# Patient Record
Sex: Female | Born: 1970
Health system: Southern US, Community
[De-identification: ages and names within clinical notes are randomized; demographics above are authoritative.]

## PROBLEM LIST (undated history)

## (undated) DIAGNOSIS — M549 Dorsalgia, unspecified: Secondary | ICD-10-CM

## (undated) DIAGNOSIS — R87619 Unspecified abnormal cytological findings in specimens from cervix uteri: Secondary | ICD-10-CM

## (undated) DIAGNOSIS — IMO0002 Reserved for concepts with insufficient information to code with codable children: Secondary | ICD-10-CM

## (undated) DIAGNOSIS — C801 Malignant (primary) neoplasm, unspecified: Secondary | ICD-10-CM

## (undated) DIAGNOSIS — K5090 Crohn's disease, unspecified, without complications: Secondary | ICD-10-CM

## (undated) DIAGNOSIS — E559 Vitamin D deficiency, unspecified: Secondary | ICD-10-CM

## (undated) DIAGNOSIS — E739 Lactose intolerance, unspecified: Secondary | ICD-10-CM

## (undated) DIAGNOSIS — M2550 Pain in unspecified joint: Secondary | ICD-10-CM

## (undated) DIAGNOSIS — D649 Anemia, unspecified: Secondary | ICD-10-CM

## (undated) HISTORY — PX: WISDOM TOOTH EXTRACTION: SHX21

## (undated) HISTORY — PX: HYSTEROSCOPY: SHX211

## (undated) HISTORY — DX: Pain in unspecified joint: M25.50

## (undated) HISTORY — DX: Lactose intolerance, unspecified: E73.9

## (undated) HISTORY — PX: CERVICAL CONE BIOPSY: SUR198

## (undated) HISTORY — PX: TYMPANOSTOMY TUBE PLACEMENT: SHX32

## (undated) HISTORY — PX: MYRINGOTOMY: SUR874

## (undated) HISTORY — DX: Dorsalgia, unspecified: M54.9

---

## 1998-05-10 ENCOUNTER — Emergency Department (HOSPITAL_COMMUNITY): Admission: EM | Admit: 1998-05-10 | Discharge: 1998-05-10 | Payer: Self-pay | Admitting: Emergency Medicine

## 2004-01-11 ENCOUNTER — Emergency Department (HOSPITAL_COMMUNITY): Admission: EM | Admit: 2004-01-11 | Discharge: 2004-01-11 | Payer: Self-pay | Admitting: Emergency Medicine

## 2006-03-16 ENCOUNTER — Encounter: Admission: RE | Admit: 2006-03-16 | Discharge: 2006-03-16 | Payer: Self-pay | Admitting: Family Medicine

## 2006-07-23 ENCOUNTER — Emergency Department (HOSPITAL_COMMUNITY): Admission: EM | Admit: 2006-07-23 | Discharge: 2006-07-23 | Payer: Self-pay | Admitting: Family Medicine

## 2011-07-01 ENCOUNTER — Other Ambulatory Visit (HOSPITAL_COMMUNITY)
Admission: RE | Admit: 2011-07-01 | Discharge: 2011-07-01 | Disposition: A | Discharge status: Home / Self Care | Payer: PRIVATE HEALTH INSURANCE | Source: Ambulatory Visit | Attending: Family Medicine | Admitting: Family Medicine

## 2011-07-01 DIAGNOSIS — Z1159 Encounter for screening for other viral diseases: Secondary | ICD-10-CM | POA: Insufficient documentation

## 2011-07-01 DIAGNOSIS — Z124 Encounter for screening for malignant neoplasm of cervix: Secondary | ICD-10-CM | POA: Insufficient documentation

## 2011-11-18 ENCOUNTER — Other Ambulatory Visit (HOSPITAL_COMMUNITY)
Admission: RE | Admit: 2011-11-18 | Discharge: 2011-11-18 | Disposition: A | Discharge status: Home / Self Care | Payer: PRIVATE HEALTH INSURANCE | Source: Ambulatory Visit | Attending: Family Medicine | Admitting: Family Medicine

## 2011-11-18 DIAGNOSIS — R87610 Atypical squamous cells of undetermined significance on cytologic smear of cervix (ASC-US): Secondary | ICD-10-CM | POA: Insufficient documentation

## 2011-12-22 DIAGNOSIS — C801 Malignant (primary) neoplasm, unspecified: Secondary | ICD-10-CM

## 2011-12-22 HISTORY — PX: ABDOMINAL HYSTERECTOMY: SHX81

## 2011-12-22 HISTORY — DX: Malignant (primary) neoplasm, unspecified: C80.1

## 2012-02-18 ENCOUNTER — Encounter (HOSPITAL_BASED_OUTPATIENT_CLINIC_OR_DEPARTMENT_OTHER): Payer: Self-pay | Admitting: *Deleted

## 2012-02-18 NOTE — Progress Notes (Signed)
NPO AFTER MN. ARRIVES AT 0930.  PRE-OP ORDERS PENDING. NEEDS HG AND URINE PREG.

## 2012-02-19 ENCOUNTER — Other Ambulatory Visit: Payer: Self-pay | Admitting: Obstetrics and Gynecology

## 2012-02-22 ENCOUNTER — Encounter (HOSPITAL_BASED_OUTPATIENT_CLINIC_OR_DEPARTMENT_OTHER): Payer: Self-pay | Admitting: Anesthesiology

## 2012-02-22 ENCOUNTER — Other Ambulatory Visit: Payer: Self-pay | Admitting: Obstetrics and Gynecology

## 2012-02-22 ENCOUNTER — Encounter (HOSPITAL_BASED_OUTPATIENT_CLINIC_OR_DEPARTMENT_OTHER): Payer: Self-pay | Admitting: *Deleted

## 2012-02-22 ENCOUNTER — Encounter (HOSPITAL_BASED_OUTPATIENT_CLINIC_OR_DEPARTMENT_OTHER)
Admission: RE | Disposition: A | Discharge status: Home / Self Care | Payer: Self-pay | Source: Ambulatory Visit | Attending: Obstetrics and Gynecology

## 2012-02-22 ENCOUNTER — Ambulatory Visit (HOSPITAL_BASED_OUTPATIENT_CLINIC_OR_DEPARTMENT_OTHER)
Admission: RE | Admit: 2012-02-22 | Discharge: 2012-02-22 | Disposition: A | Discharge status: Home / Self Care | Payer: PRIVATE HEALTH INSURANCE | Source: Ambulatory Visit | Attending: Obstetrics and Gynecology | Admitting: Obstetrics and Gynecology

## 2012-02-22 ENCOUNTER — Ambulatory Visit (HOSPITAL_BASED_OUTPATIENT_CLINIC_OR_DEPARTMENT_OTHER): Payer: PRIVATE HEALTH INSURANCE | Admitting: Anesthesiology

## 2012-02-22 DIAGNOSIS — Z87442 Personal history of urinary calculi: Secondary | ICD-10-CM | POA: Insufficient documentation

## 2012-02-22 DIAGNOSIS — D069 Carcinoma in situ of cervix, unspecified: Secondary | ICD-10-CM | POA: Insufficient documentation

## 2012-02-22 DIAGNOSIS — M25569 Pain in unspecified knee: Secondary | ICD-10-CM | POA: Insufficient documentation

## 2012-02-22 HISTORY — PX: LEEP: SHX91

## 2012-02-22 HISTORY — DX: Unspecified abnormal cytological findings in specimens from cervix uteri: R87.619

## 2012-02-22 HISTORY — DX: Reserved for concepts with insufficient information to code with codable children: IMO0002

## 2012-02-22 HISTORY — DX: Anemia, unspecified: D64.9

## 2012-02-22 LAB — POCT HEMOGLOBIN-HEMACUE: Hemoglobin: 11.7 g/dL — ABNORMAL LOW (ref 12.0–15.0)

## 2012-02-22 SURGERY — LEEP (LOOP ELECTROSURGICAL EXCISION PROCEDURE)
Anesthesia: General | Site: Cervix | Wound class: Clean Contaminated

## 2012-02-22 MED ORDER — MIDAZOLAM HCL 5 MG/5ML IJ SOLN
INTRAMUSCULAR | Status: DC | PRN
  Administered 2012-02-22: 2 mg via INTRAVENOUS

## 2012-02-22 MED ORDER — ACETAMINOPHEN 10 MG/ML IV SOLN
INTRAVENOUS | Status: DC | PRN
  Administered 2012-02-22: 1000 mg via INTRAVENOUS

## 2012-02-22 MED ORDER — CEFAZOLIN SODIUM-DEXTROSE 2-3 GM-% IV SOLR
2.0000 g | INTRAVENOUS | Status: AC
  Administered 2012-02-22: 2 g via INTRAVENOUS

## 2012-02-22 MED ORDER — FENTANYL CITRATE 0.05 MG/ML IJ SOLN
25.0000 ug | INTRAMUSCULAR | Status: DC | PRN
  Administered 2012-02-22: 25 ug via INTRAVENOUS

## 2012-02-22 MED ORDER — ACETIC ACID 5 % SOLN
Status: DC | PRN
  Administered 2012-02-22: 1 via TOPICAL

## 2012-02-22 MED ORDER — KETOROLAC TROMETHAMINE 30 MG/ML IJ SOLN
INTRAMUSCULAR | Status: DC | PRN
  Administered 2012-02-22: 30 mg via INTRAVENOUS

## 2012-02-22 MED ORDER — PROPOFOL 10 MG/ML IV EMUL
INTRAVENOUS | Status: DC | PRN
  Administered 2012-02-22: 200 mg via INTRAVENOUS

## 2012-02-22 MED ORDER — LIDOCAINE HCL (CARDIAC) 20 MG/ML IV SOLN
INTRAVENOUS | Status: DC | PRN
  Administered 2012-02-22: 100 mg via INTRAVENOUS

## 2012-02-22 MED ORDER — LACTATED RINGERS IV SOLN
INTRAVENOUS | Status: DC
  Administered 2012-02-22: 13:00:00 via INTRAVENOUS

## 2012-02-22 MED ORDER — NAPROXEN SODIUM 220 MG PO TABS: ORAL_TABLET | ORAL | Status: DC

## 2012-02-22 MED ORDER — IODINE STRONG (LUGOLS) 5 % PO SOLN
ORAL | Status: DC | PRN
  Administered 2012-02-22: 0.1 mL via ORAL

## 2012-02-22 MED ORDER — DEXAMETHASONE SODIUM PHOSPHATE 4 MG/ML IJ SOLN
INTRAMUSCULAR | Status: DC | PRN
  Administered 2012-02-22: 10 mg via INTRAVENOUS

## 2012-02-22 MED ORDER — FENTANYL CITRATE 0.05 MG/ML IJ SOLN
INTRAMUSCULAR | Status: DC | PRN
  Administered 2012-02-22 (×5): 50 ug via INTRAVENOUS

## 2012-02-22 MED ORDER — LACTATED RINGERS IV SOLN
INTRAVENOUS | Status: DC
  Administered 2012-02-22: 10:00:00 via INTRAVENOUS

## 2012-02-22 MED ORDER — LIDOCAINE HCL 2 % IJ SOLN
INTRAMUSCULAR | Status: DC | PRN
  Administered 2012-02-22: 10 mL

## 2012-02-22 MED ORDER — ONDANSETRON HCL 4 MG/2ML IJ SOLN
INTRAMUSCULAR | Status: DC | PRN
  Administered 2012-02-22: 4 mg via INTRAVENOUS

## 2012-02-22 SURGICAL SUPPLY — 33 items
CATH ROBINSON RED A/P 16FR (CATHETERS) ×2 IMPLANT
CLOTH BEACON ORANGE TIMEOUT ST (SAFETY) ×2 IMPLANT
COVER TABLE BACK 60X90 (DRAPES) ×2 IMPLANT
DRAPE LG THREE QUARTER DISP (DRAPES) ×2 IMPLANT
DRAPE UNDERBUTTOCKS STRL (DRAPE) ×2 IMPLANT
DRESSING TELFA 8X3 (GAUZE/BANDAGES/DRESSINGS) ×2 IMPLANT
ELECT BALL LEEP 3MM BLK (ELECTRODE) IMPLANT
ELECT BALL LEEP 5MM RED (ELECTRODE) ×1 IMPLANT
ELECT LOOP 20X12 DISP (CUTTING LOOP)
ELECT LOOP LLETZ 10X10 DISP (CUTTING LOOP)
ELECT LOOP LLETZ 15X12 DISP (CUTTING LOOP) ×1 IMPLANT
ELECTRODE LOOP 20X12 DISP (CUTTING LOOP) IMPLANT
ELECTRODE LOOP LTZ 10X10 DISP (CUTTING LOOP) IMPLANT
GLOVE BIO SURGEON STRL SZ 6.5 (GLOVE) ×2 IMPLANT
GLOVE INDICATOR 6.5 STRL GRN (GLOVE) ×2 IMPLANT
GLOVE INDICATOR 7.0 STRL GRN (GLOVE) ×4 IMPLANT
GOWN PREVENTION PLUS LG XLONG (DISPOSABLE) ×2 IMPLANT
GOWN STRL NON-REIN LRG LVL3 (GOWN DISPOSABLE) ×1 IMPLANT
LEGGING LITHOTOMY PAIR STRL (DRAPES) ×2 IMPLANT
NDL SPNL 22GX3.5 QUINCKE BK (NEEDLE) ×1 IMPLANT
NEEDLE SPNL 22GX3.5 QUINCKE BK (NEEDLE) ×2 IMPLANT
NS IRRIG 500ML POUR BTL (IV SOLUTION) IMPLANT
PACK BASIN DAY SURGERY FS (CUSTOM PROCEDURE TRAY) ×2 IMPLANT
PAD OB MATERNITY 4.3X12.25 (PERSONAL CARE ITEMS) ×2 IMPLANT
PAD PREP 24X48 CUFFED NSTRL (MISCELLANEOUS) ×2 IMPLANT
SCOPETTES 8  STERILE (MISCELLANEOUS) ×3
SCOPETTES 8 STERILE (MISCELLANEOUS) ×3 IMPLANT
SURGILUBE 2OZ TUBE FLIPTOP (MISCELLANEOUS) ×2 IMPLANT
SYR CONTROL 10ML LL (SYRINGE) ×2 IMPLANT
TOWEL OR 17X24 6PK STRL BLUE (TOWEL DISPOSABLE) ×2 IMPLANT
TRAY DSU PREP LF (CUSTOM PROCEDURE TRAY) ×2 IMPLANT
VACUUM HOSE/TUBING 7/8INX6FT (MISCELLANEOUS) ×2 IMPLANT
WATER STERILE IRR 500ML POUR (IV SOLUTION) ×3 IMPLANT

## 2012-02-22 NOTE — H&P (View-Only) (Signed)
02/18/2012  History of Present Illness  General:  41 y/o G0 presents for pre-op for LEEP due to severe dysplasia by colposcopy. Pt declines in office procedure in favor of outpt surgery center. She request sedation due to anxiety and discomfort with pelvic exams. Pt is doing well today. No complaints. Pt due to start menses 4 days after procedure. Agreeable to Provera to delay menses.   Current Medications  Multivitamins Tablet as directed   Medication List reviewed and reconciled with the patient   Past Medical History  Kidney stones  Right knee pain   Surgical History  PE tubes at 41 y/o    Family History  Father: dibetes, kidney disease, hypertension   Mother: kidney stones   denies any GYN family cancer hx.   Gyn History  Sexual activity currently sexually active.  Periods : every month.  LMP 01/28/12.  Birth control condoms.  Last pap smear date 11/18/11, HGSIL.  Abnormal pap smear HGSIL.  Menarche 46.    OB History  Never been pregnant per patient.    Allergies  N.K.D.A.   Hospitalization/Major Diagnostic Procedure  PE Tubes at age 11    Vital Signs  Wt 281, Wt change -1 lb, Ht 69.5, BMI 40.90, Temp 98.5, Pulse sitting 89, BP sitting 118/70.   Physical Examination  GENERAL:  Patient appears alert and oriented.  General Appearance: well-appearing, well-developed, no acute distress.  Speech: clear.  NECK:  Thyroid: no thyromegaly.  LUNGS:  General clear bilaterally, no crackles, no wheezes.  HEART:  Heart sounds: RRR, no murmur.  ABDOMEN:  General: no masses tenderness or organomegaly, non distended, BS normal, obese.  FEMALE GENITOURINARY:  General deferred.  EXTREMITIES:  Clubbing: no.  Cyanosis: no.  Edema: none.  Tremors: no.     Assessments   1. Pre-op exam - V72.84 (Primary)   2. CIN III (cervical intraepithelial neoplasia grade III) with severe dysplasia - 233.1   Treatment  1. Pre-op exam  Start Provera Tablet, 10 MG, 1 tablet,  Orally, daily, 10 days, 10, Refills 0 Pt counseled on procedure, R/B/A. All questions answered. Pt instructed to start Provera today.

## 2012-02-22 NOTE — Interval H&P Note (Signed)
History and Physical Interval Note:  02/22/2012 12:27 PM  Joan [name]  has presented today for surgery, with the diagnosis of Abnormal Pap  The various methods of treatment have been discussed with the patient and family. After consideration of risks, benefits and other options for treatment, the patient has consented to  Procedure(s) (LRB): LOOP ELECTROSURGICAL EXCISION PROCEDURE (LEEP) (N/A) as a surgical intervention .  The patients' history has been reviewed, patient examined, no change in status, stable for surgery.  I have reviewed the patients' chart and labs.  Questions were answered to the patient's satisfaction.     Simona Huh, Zaia Carre

## 2012-02-22 NOTE — Discharge Instructions (Addendum)
Loop Electrosurgical Excision Procedure Care After Refer to this sheet in the next few weeks. These instructions provide you with information on caring for yourself after your procedure. Your caregiver may also give you more specific instructions. Your treatment has been planned according to current medical practices, but problems sometimes occur. Call your caregiver if you have any problems or questions after your procedure. HOME CARE INSTRUCTIONS   Do not use tampons, douche, or have sexual intercourse for 2 weeks or as directed by your caregiver.   Begin normal activities if you have no or minimal cramping or bleeding, unless directed otherwise by your caregiver.   Take your temperature if you feel sick. Write down your temperature on paper, and tell your caregiver if you have a fever.   Take all medicines as directed by your caregiver.   Keep all your follow-up appointments and Pap tests as directed by your caregiver.  SEEK IMMEDIATE MEDICAL CARE IF:   IF YOU CHANGE OR FILL A PAD EVERY HOUR FOR 2 HOURS CONSECUTIVELY  You have bleeding that is heavier or longer than a normal menstrual cycle.   You have bleeding that is bright red.   You have blood clots.   You have a fever.   You have increasing cramps or pain not relieved by medicine.   You develop abdominal pain that does not seem to be related to the same area of earlier cramping and pain.   You are lightheaded, unusually weak, or faint.   You develop painful or bloody urination.   You develop a bad smelling vaginal discharge.  MAKE SURE YOU:  Understand these instructions.   Will watch your condition.   Will get help right away if you are not doing well or get worse.  Document Released: 08/20/2011 Document Revised: 11/26/2011 Document Reviewed: 08/20/2011 [facility] Patient Information 2012 [place]. General Anesthetic, Adult A doctor specialized in giving anesthesia (anesthesiologist) or a nurse specialized  in giving anesthesia (nurse anesthetist) gives medicine that makes you sleep while a procedure is performed (general anesthetic). Once the general anesthetic has been administered, you will be in a sleeplike state in which you feel no pain. After having a general anestheticyou may feel:   Dizzy.   Weak.   Drowsy.   Confused.  These feelings are normal and can be expected to last for up to 24 hours after the procedure is completed.  LET YOUR CAREGIVER KNOW ABOUT:  Allergies you have.   Medications you are taking, including herbs, eye drops, over the counter medications, dietary supplements, and creams.   Previous problems you have had with anesthetics or numbing medicines.   Use of cigarettes, alcohol, or illicit drugs.   Possibility of pregnancy, if this applies.   History of bleeding or blood disorders, including blood clots and clotting disorders.   Previous surgeries you have had and types of anesthetics you have received.   Family medical history, especially anesthetic problems.   Other health problems.  BEFORE THE PROCEDURE  You may brush your teeth on the morning of surgery but you should have no solid food or non-clear liquids for a minimum of 8 hours prior to your procedure. Clear liquids (water, black coffee, and tea) are acceptable in small amounts until 2 hours prior to your procedure.   You may take your regular medications the morning of your procedure unless your caregiver indicates otherwise.  AFTER THE PROCEDURE  After surgery, you will be taken to the recovery area where a nurse will  monitor your progress. You will be allowed to go home when you are awake, stable, taking fluids well, and without serious pain or complications.   For the first 24 hours following an anesthetic:   Have a responsible person with you.   Do not drive a car. If you are alone, do not take public transportation.   Do not engage in strenuous activity. You may usually resume normal  activities the next day, or as advised by your caregiver.   Do not drink alcohol.   Do not take medicine that has not been prescribed by your caregiver.   Do not sign important papers or make important decisions as your judgement may be impaired.   You may resume a normal diet as directed.   Change bandages (dressings) as directed.   Only take over-the-counter or prescription medicines for pain, discomfort, or fever as directed by your caregiver.  If you have questions or problems that seem related to the anesthetic, call the hospital and ask for the anesthetist, anesthesiologist, or anesthesia department. SEEK IMMEDIATE MEDICAL CARE IF:   You develop a rash.   You have difficulty breathing.   You have chest pain.   You have allergic problems.   You have uncontrolled nausea.   You have uncontrolled vomiting.   You develop any serious bleeding, especially from the incision site.  Document Released: 03/15/2008 Document Revised: 11/26/2011 Document Reviewed: 04/09/2011 [facility] Patient Information 2012 [place].

## 2012-02-22 NOTE — H&P (Signed)
02/18/2012  History of Present Illness  General:  41 y/o G0 presents for pre-op for LEEP due to severe dysplasia by colposcopy. Pt declines in office procedure in favor of outpt surgery center. She request sedation due to anxiety and discomfort with pelvic exams. Pt is doing well today. No complaints. Pt due to start menses 4 days after procedure. Agreeable to Provera to delay menses.   Current Medications  Multivitamins Tablet as directed   Medication List reviewed and reconciled with the patient   Past Medical History  Kidney stones  Right knee pain   Surgical History  PE tubes at 41 y/o    Family History  Father: dibetes, kidney disease, hypertension   Mother: kidney stones   denies any GYN family cancer hx.   Gyn History  Sexual activity currently sexually active.  Periods : every month.  LMP 01/28/12.  Birth control condoms.  Last pap smear date 11/18/11, HGSIL.  Abnormal pap smear HGSIL.  Menarche 43.    OB History  Never been pregnant per patient.    Allergies  N.K.D.A.   Hospitalization/Major Diagnostic Procedure  PE Tubes at age 12    Vital Signs  Wt 281, Wt change -1 lb, Ht 69.5, BMI 40.90, Temp 98.5, Pulse sitting 89, BP sitting 118/70.   Physical Examination  GENERAL:  Patient appears alert and oriented.  General Appearance: well-appearing, well-developed, no acute distress.  Speech: clear.  NECK:  Thyroid: no thyromegaly.  LUNGS:  General clear bilaterally, no crackles, no wheezes.  HEART:  Heart sounds: RRR, no murmur.  ABDOMEN:  General: no masses tenderness or organomegaly, non distended, BS normal, obese.  FEMALE GENITOURINARY:  General deferred.  EXTREMITIES:  Clubbing: no.  Cyanosis: no.  Edema: none.  Tremors: no.     Assessments   1. Pre-op exam - V72.84 (Primary)   2. CIN III (cervical intraepithelial neoplasia grade III) with severe dysplasia - 233.1   Treatment  1. Pre-op exam  Start Provera Tablet, 10 MG, 1 tablet,  Orally, daily, 10 days, 10, Refills 0 Pt counseled on procedure, R/B/A. All questions answered. Pt instructed to start Provera today.

## 2012-02-22 NOTE — Transfer of Care (Signed)
Immediate Anesthesia Transfer of Care Note  Patient: Joan [name]  Procedure(s) Performed: Procedure(s) (LRB): LOOP ELECTROSURGICAL EXCISION PROCEDURE (LEEP) (N/A)  Patient Location: PACU  Anesthesia Type: General  Level of Consciousness: drowsy, arouses to name, follows commands  Airway & Oxygen Therapy: Patient Spontanous Breathing and Patient connected to face mask oxygen  Post-op Assessment: Report given to PACU RN and Post -op Vital signs reviewed and stable  Post vital signs: Reviewed and stable  Complications: No apparent anesthesia complications

## 2012-02-22 NOTE — Brief Op Note (Signed)
02/22/2012  12:44 PM  PATIENT:  Joan [name]  41 y.o. female  PRE-OPERATIVE DIAGNOSIS:  Cervical Dysplasia, CIN III  POST-OPERATIVE DIAGNOSIS:  Cervical Dysplasia, CIN III  PROCEDURE:  Procedure(s) (LRB): LOOP ELECTROSURGICAL EXCISION PROCEDURE (LEEP) (N/A)  SURGEON:  Surgeon(s) and Role:    * Thurnell Lose, MD - Primary  PHYSICIAN ASSISTANT: None  ASSISTANTS: Technician   ANESTHESIA:   LMA  EBL:  Total I/O In: 900 [I.V.:900] Out: - EBL less than 50  BLOOD ADMINISTERED:none  DRAINS: none   LOCAL MEDICATIONS USED:  LIDOCAINE 2% without epinephrine  SPECIMEN:  Source of Specimen:  Cervix-anterior, posterior, endocervix  DISPOSITION OF SPECIMEN:  PATHOLOGY  COUNTS:  YES  TOURNIQUET:  * No tourniquets in log *  DICTATION: .Other Dictation: Dictation Number [id number]  PLAN OF CARE: Discharge to home after PACU  PATIENT DISPOSITION:  PACU - hemodynamically stable.   Delay start of Pharmacological VTE agent (>24hrs) due to surgical blood loss or risk of bleeding: yes

## 2012-02-22 NOTE — Anesthesia Postprocedure Evaluation (Signed)
  Anesthesia Post-op Note  Patient: Joan [name]  Procedure(s) Performed: Procedure(s) (LRB): LOOP ELECTROSURGICAL EXCISION PROCEDURE (LEEP) (N/A)  Patient Location: PACU  Anesthesia Type: General  Level of Consciousness: awake and alert   Airway and Oxygen Therapy: Patient Spontanous Breathing  Post-op Pain: mild  Post-op Assessment: Post-op Vital signs reviewed, Patient's Cardiovascular Status Stable, Respiratory Function Stable, Patent Airway and No signs of Nausea or vomiting  Post-op Vital Signs: stable  Complications: No apparent anesthesia complications

## 2012-02-22 NOTE — Anesthesia Procedure Notes (Signed)
Procedure Name: LMA Insertion Date/Time: 02/22/2012 11:45 AM Performed by: Wyvonna Plum Pre-anesthesia Checklist: Patient identified, Emergency Drugs available, Suction available and Patient being monitored Patient Re-evaluated:Patient Re-evaluated prior to inductionOxygen Delivery Method: Circle System Utilized Preoxygenation: Pre-oxygenation with 100% oxygen Intubation Type: IV induction Ventilation: Mask ventilation without difficulty LMA: LMA with gastric port inserted LMA Size: 4.0 Number of attempts: 1 Placement Confirmation: positive ETCO2 Tube secured with: Tape Dental Injury: Teeth and Oropharynx as per pre-operative assessment

## 2012-02-22 NOTE — Anesthesia Preprocedure Evaluation (Addendum)
Anesthesia Evaluation  Patient identified by MRN, date of birth, ID band Patient awake    Reviewed: Allergy & Precautions, H&P , NPO status , Patient's Chart, lab work & pertinent test results, reviewed documented beta blocker date and time   Airway Mallampati: II TM Distance: >3 FB Neck ROM: full    Dental  (+) Teeth Intact and Dental Advisory Given   Pulmonary neg pulmonary ROS,  breath sounds clear to auscultation  Pulmonary exam normal       Cardiovascular Exercise Tolerance: Good negative cardio ROS  Rhythm:regular Rate:Normal     Neuro/Psych negative neurological ROS  negative psych ROS   GI/Hepatic negative GI ROS, Neg liver ROS,   Endo/Other  negative endocrine ROS  Renal/GU negative Renal ROS  negative genitourinary   Musculoskeletal   Abdominal   Peds  Hematology negative hematology ROS (+)   Anesthesia Other Findings   Reproductive/Obstetrics negative OB ROS                          Anesthesia Physical Anesthesia Plan  ASA: I  Anesthesia Plan: General   Post-op Pain Management:    Induction:   Airway Management Planned: LMA  Additional Equipment:   Intra-op Plan:   Post-operative Plan:   Informed Consent: I have reviewed the patients History and Physical, chart, labs and discussed the procedure including the risks, benefits and alternatives for the proposed anesthesia with the patient or authorized representative who has indicated his/her understanding and acceptance.   Dental Advisory Given  Plan Discussed with: CRNA and Surgeon  Anesthesia Plan Comments:         Anesthesia Quick Evaluation

## 2012-02-23 ENCOUNTER — Encounter (HOSPITAL_BASED_OUTPATIENT_CLINIC_OR_DEPARTMENT_OTHER): Payer: Self-pay | Admitting: Obstetrics and Gynecology

## 2012-02-23 NOTE — Addendum Note (Signed)
Addendum  created 02/23/12 6122 by Peyton Najjar, MD   Modules edited:Anesthesia Responsible Staff

## 2012-02-23 NOTE — Addendum Note (Signed)
Addendum  created 02/23/12 5913 by Peyton Najjar, MD   Modules edited:Anesthesia Responsible Staff

## 2012-02-23 NOTE — Op Note (Signed)
NAMEJANECIA, [name]               ACCOUNT NO.:  [id number]  MEDICAL RECORD NO.:  [id number]  LOCATION:                               FACILITY:  [facility]  PHYSICIAN:  Jola Schmidt, MD   DATE OF BIRTH:  16-May-1971  DATE OF PROCEDURE:  02/22/2012 DATE OF DISCHARGE:                              OPERATIVE REPORT   PREOPERATIVE DIAGNOSIS:  Cervical dysplasia, cervical intraepithelial neoplasia-3.  POSTOP DIAGNOSIS:  Cervical dysplasia, cervical intraepithelial neoplasia-3.  PROCEDURE:  Loop electrosurgical excision procedure.  SURGEON:  Jola Schmidt, MD.  PHYSICIAN ASSISTANT:  None.  ANESTHESIA:  LMA.  IV FLUIDS:  900.  ESTIMATED BLOOD LOSS:  Less than 50.  DRAINS:  No drains.  ANESTHESIA:  Local anesthetic lidocaine 2% without epinephrine, 10 cc.  SOURCE OF SPECIMEN:  Cervix; which was anterior, posterior, and endocervix.  DISPOSITION OF SPECIMENS:  To pathology.  Discharged home after PACU to PACU hemodynamically stable.  FINDINGS:  After Lugol staining larger than anticipated lesion mostly on the anterior cervix and at the transformation zone.  No discrete lesions were seen.  The procedure was uncomplicated.  Blood loss was normal.  After she underwent an LMA anesthesia without complication, she was placed in the dorsal lithotomy position  INDICATIONS:  [name] is a 41 year old, gravida 0, who had an abnormal Pap smear and after colposcopy severe dysplasia was confirmed, CIN-3.  Due to anxiety and significant discomfort during pelvic exams, the patient requested to have the procedure done at an outpatient surgery center under sedation.  She was counseled on the risks of the procedure prior to surgery in the preoperative exam and immediately in the holding area.  PROCEDURE IN DETAIL:  [name] was identified in the holding area. Consent was reviewed and all questions were answered.  She was then taken to the operating room and underwent LMA anesthesia  without complications.  A time-out was observed.  She was then prepped and draped in a normal sterile fashion.  A coated Graves speculum was then inserted into the vagina.  Lugol's solution was then applied to the cervix and decreased Lugol's uptake was noted primarily on the anterior lip of the cervix and on the lower uterine segment.  A medium-sized loop was then used and the cervix was then shaved in the anterior, posterior, and endocervical areas without complications.  Prior to proceeding, lidocaine 2% of 10 mL was injected paracervically.  After the cervical segments were removed, the LEEP bed was then cauterized with a roller ball for adequate hemostasis.  Then, the Monsel's paste was applied to the bed of the cervix.  The vagina was cleared of all clots.  All the instruments were removed from the vagina.  All instruments and sponge counts were correct x3.  The patient was awakened and taken to the recovery room in stable condition.  She received Ancef 2 g prior to the procedure.  She had SCDs in place at all times.     Jola Schmidt, MD     EBV/MEDQ  D:  02/22/2012  T:  02/23/2012  Job:  [id number]

## 2013-02-27 ENCOUNTER — Other Ambulatory Visit: Payer: Self-pay

## 2013-02-27 DIAGNOSIS — Z1231 Encounter for screening mammogram for malignant neoplasm of breast: Secondary | ICD-10-CM

## 2013-04-04 ENCOUNTER — Ambulatory Visit
Admission: RE | Admit: 2013-04-04 | Discharge: 2013-04-04 | Disposition: A | Discharge status: Home / Self Care | Payer: PRIVATE HEALTH INSURANCE | Source: Ambulatory Visit

## 2013-04-04 DIAGNOSIS — Z1231 Encounter for screening mammogram for malignant neoplasm of breast: Secondary | ICD-10-CM

## 2013-09-01 ENCOUNTER — Other Ambulatory Visit: Payer: Self-pay | Admitting: Family Medicine

## 2013-09-01 ENCOUNTER — Ambulatory Visit
Admission: RE | Admit: 2013-09-01 | Discharge: 2013-09-01 | Disposition: A | Discharge status: Home / Self Care | Payer: PRIVATE HEALTH INSURANCE | Source: Ambulatory Visit | Attending: Family Medicine | Admitting: Family Medicine

## 2013-09-01 DIAGNOSIS — C530 Malignant neoplasm of endocervix: Secondary | ICD-10-CM

## 2014-08-20 ENCOUNTER — Ambulatory Visit (INDEPENDENT_AMBULATORY_CARE_PROVIDER_SITE_OTHER): Payer: PRIVATE HEALTH INSURANCE | Admitting: Internal Medicine

## 2014-08-20 ENCOUNTER — Encounter: Payer: Self-pay | Admitting: Internal Medicine

## 2014-08-20 VITALS — BP 110/68 | HR 82 | Temp 99.0°F | Resp 12 | Ht 69.75 in | Wt 304.0 lb

## 2014-08-20 DIAGNOSIS — R635 Abnormal weight gain: Secondary | ICD-10-CM

## 2014-08-20 LAB — T3, FREE: T3 FREE: 3 pg/mL (ref 2.3–4.2)

## 2014-08-20 LAB — T4, FREE: Free T4: 0.77 ng/dL (ref 0.60–1.60)

## 2014-08-20 LAB — LUTEINIZING HORMONE: LH: 3.9 m[IU]/mL

## 2014-08-20 LAB — FOLLICLE STIMULATING HORMONE: FSH: 3.1 m[IU]/mL

## 2014-08-20 LAB — TSH: TSH: 2.56 u[IU]/mL (ref 0.35–4.50)

## 2014-08-20 NOTE — Progress Notes (Signed)
Patient ID: Joan [name], female   DOB: 1971/09/14, 43 y.o.   MRN: [id number]   HPI  Joan [name] is a 44 y.o.-year-old female, referred by her PCP, Dr. Kelton Pillar, in consultation for endocrine causes for obesity. GI: Dr Bernestine Amass.  Patient c/o: - weight gain: 15-20 lbs up and down; overall ~25 lbs  She was dx with cervical cancer in 2013 >> lost 50 lbs, GI sxs >> seen by Dr Zipporah Plants >> dx with Crohn's ds. She had a hysterectomy in 11/2012. She was started on meds for Crohn's ds. No steroids, only Abx, then Mesalamine, Pentasa >> now only on Pentasa.   Pt's meals are: - Breakfast: protein shake + banana +/- cheerios; smoothie - 1h later: snack: banana, grapes, or other fresh fruit; sometimes chocolate; sometimes protein bars - Lunch: not eating out; leftover dinner; brings from home; sometimes chai tea - Dinner: roast chicken, grilled chicken/turkey, vegetables, spinach salad; tries not to have starch - Snacks: veggies, protein bars  She saw a nutritionist before.  - No h/o DM. Last hemoglobin A1c was: 07/18/2014: 5.6%  07/17/2013: 5.7%  - No h/o hypothyroidism. Last thyroid tests: 07/18/2014: TSH 4.33, fT4 0.76 (0.61-1.12), fT3 2.57 (2.50-3.9), TPO Abs 8 (0-34) - records received after the visit 07/17/2013: TSH 3.53  - last set of lipids: 07/18/2014: 180/85/36/127  - last CMP: normal in 07/18/2014.  Pt has FH of DM in father. MGM and cousins with thyroid ds.   Had regular menses. She was on Prometrium in the past, not in last year. She does not have hot flushes.  ROS: Constitutional: + both: weight gain/loss, + fatigue, no subjective hyperthermia/hypothermia, + poor sleep Eyes: no blurry vision, no xerophthalmia ENT: no sore throat, no nodules palpated in throat, no dysphagia/odynophagia, no hoarseness Cardiovascular: no CP/SOB/palpitations/leg swelling Respiratory: no cough/SOB Gastrointestinal: no N/V/+D/no C Musculoskeletal: no muscle/+ joint aches Skin: no  rashes, + itching Neurological: no tremors/numbness/tingling/dizziness Psychiatric: no depression/anxiety  Past Medical History  Diagnosis Date  . Abnormal Pap smear   . Anemia    Past Surgical History  Procedure Laterality Date  . Wisdom tooth extraction  ORAL SURG OFFICE--  1998  . Tympanostomy tube placement  AGE 27  . Leep  02/22/2012    Procedure: LOOP ELECTROSURGICAL EXCISION PROCEDURE (LEEP);  Surgeon: Thurnell Lose, MD;  Location: [facility];  Service: Gynecology;  Laterality: N/A;   History   Social History  . Marital Status: Single    Spouse Name: N/A    Number of Children: 0   Occupational History  . Project Financial planner   Social History Main Topics  . Smoking status: Never Smoker   . Smokeless tobacco: Never Used  . Alcohol Use: Wine rarely  . Drug Use: No    Current Outpatient Prescriptions on File Prior to Visit  Medication Sig Dispense Refill  . Multiple Vitamins-Iron (MULTIVITAMIN/IRON PO) Take 1 tablet by mouth daily.      . naproxen sodium (ANAPROX) 220 MG tablet Take 2 tablets by mouth with meals every 12 hours prn pain.  10 tablet  0  . progesterone (PROMETRIUM) 100 MG capsule Take 100 mg by mouth daily.       No current facility-administered medications on file prior to visit.   No Known Allergies  FH: - DM2 in father and PGM and PGM - HTN in father - HL in father  PE: BP 110/68  Pulse 82  Temp(Src) 99 F (37.2 C) (Oral)  Resp 12  Ht  5' 9.75" (1.772 m)  Wt 304 lb (137.893 kg)  BMI 43.92 kg/m2  SpO2 97%  LMP 01/28/2012 Wt Readings from Last 3 Encounters:  08/20/14 304 lb (137.893 kg)  02/18/12 280 lb (127.007 kg)  02/18/12 280 lb (127.007 kg)   Constitutional: obese, in NAD, no full [place] fat pads Eyes: PERRLA, EOMI, no exophthalmos ENT: moist mucous membranes, no thyromegaly, no cervical lymphadenopathy Cardiovascular: RRR, No MRG Respiratory: CTA B Gastrointestinal: abdomen soft, NT, ND, BS+ Musculoskeletal: no  deformities, strength intact in all 4 Skin: moist, warm, no rashes, dark terminal hair-  chin Neurological: no tremor with outstretched hands, DTR normal in all 4  ASSESSMENT: 1. Obesity - class III - see below: BMI Classification:  < 18.5 underweight   18.5-24.9 normal weight   25.0-29.9 overweight   30.0-34.9 class I obesity   35.0-39.9 class II obesity   ? 40.0 class III obesity   PLAN: 1. Obesity  - Discussed diet in detail, reviewing each of her food choices and advised for alternatives (also given a list of healthy substitutions, the patient instructions section). Given specific examples about healthy breakfast, lunch, dinner and snack choices. Suggested a 3 week period of cleansing with gren juices, as tolerated. - No drinking sodas of any kind.  - Advised to eat low glycemic index foods. Avoid highly-processed sugars.  - Discussed about and given instructions about materials to use for the plant-based diet (please see patient instructions) - Discussed about exercise and importance of getting any level of activity during the day, even walking. - Advised to get My Fitness Pal application - we discussed about endocrine causes for obesity, to include:   hypothyroidism (repeat thyroid panel)  Cushing disease (will check 24-hour urine cortisol level - discussed how to do the collection correctly)  menopause (will check LH, FSH, estradiol). An AMH was recently checked by PCP: 0.568 (but unclear the normal ranges for the lab)  PCOS (will check testosterone) - we will schedule a new appt if the above labs are normal  Orders Placed This Encounter  Procedures  . TSH  . T4, free  . T3, free  . Thyroid Peroxidase Antibody  . Estradiol  . Testosterone, free  . Luteinizing hormone  . Follicle Stimulating Hormone  . Creatinine, urine, 24 hour  . Cortisol, urine, 24 hour   Office Visit on 08/20/2014  Component Date Value Ref Range Status  . TSH 08/20/2014 2.56  0.35 -  4.50 uIU/mL Final  . Free T4 08/20/2014 0.77  0.60 - 1.60 ng/dL Final  . T3, Free 08/20/2014 3.0  2.3 - 4.2 pg/mL Final  . Thyroid Peroxidase Antibody 08/20/2014 <1  <9 IU/mL Final   Comment: ** Please note change in reference range(s). **                                                     **Please note change in methodology. If re-baselining is needed, for                          patients who are being serially tested, please call customer service,                          within 3 days of collection, to request to  add on the appropriate                          re-baselining test code for the previous methodology, at no charge.**  . Estradiol, Free 08/20/2014 CANCELED   Final   Result canceled by the ancillary  . Estradiol 08/20/2014 CANCELED   Final   Result canceled by the ancillary  . Results Received 08/20/2014 CANCELED   Final   Result canceled by the ancillary  . [place] 08/20/2014 3.90   Final   Comment: Female Reference Range:20-70 yrs     1.5-9.3 mIU/mL>70 yrs       3.1-35.6 mIU/mLFemale Reference Range:Follicular Phase     0.3-88.8 mIU/mLMidcycle             8.7-76.3 mIU/mLLuteal Phase         0.5-16.9 mIU/mL  Post Menopausal      15.9-54.0                           mIU/mLPregnant             <1.5 mIU/mLContraceptives       0.7-5.6 mIU/mL  . [facility] 08/20/2014 3.1   Final   Female Reference Range:  1.4-18.1 mIU/mLFemale Reference Range:Follicular Phase          2.5-10.2 mIU/mLMidCycle Peak          3.4-33.4 mIU/mLLuteal Phase          1.5-9.1 mIU/mLPost Menopausal     23.0-116.3 mIU/mLPregnant          <0.3 mIU/mL  . Testosterone 08/20/2014 20  10 - 70 ng/dL Final   Comment:           Tanner Stage       Female              Female                                        I              < 30 ng/dL        < 10 ng/dL                                        II             < 150 ng/dL       < 30 ng/dL                                        III            100-320 ng/dL     < 35 ng/dL                                         IV             200-970 ng/dL     15-40 ng/dL  V/Adult        300-890 ng/dL     10-70 ng/dL                             . Sex Hormone Binding 08/20/2014 29  18 - 114 nmol/L Final  . Testosterone, Free 08/20/2014 3.8  0.6 - 6.8 pg/mL Final   Comment:                            The concentration of free testosterone is derived from a mathematical                          expression based on constants for the binding of testosterone to sex                          hormone-binding globulin and albumin.  . Testosterone-% Free 08/20/2014 1.9  0.4 - 2.4 % Final   All labs normal - no signs of PCOS, hypothyroidism, Hashimoto's thyroiditis, early menopause. We will await the urine cortisol test. The estradiol test was cancelled by the lab, but I believe we do not need this in the context of all the above being normal.  Component     Latest Ref Rng 08/28/2014  Creatinine, Urine      167.5  Creatinine, 24H Ur     700 - 1800 mg/day 2680 (H)  Cortisol (Ur), Free     4.0 - 50.0 mcg/24 h 61.9 (H)  Results received     0.63 - 2.50 g/24 h 2.71 (H)  Higher urinary creatinine and cortisol (the urinary cortisol can be higher if the urine creatinine is higher) >> I will check a Dexamethasone suppression test.   DST: Component     Latest Ref Rng 09/06/2014  Cortisol, Plasma      3.1  Threshold for positivity varies b/w 1.8 to 5, therefore, the above value is in the "gray zone". Will repeat the DST in 2 mo and will add a Dexamethasone level to be drawn at the same time.

## 2014-08-20 NOTE — Patient Instructions (Addendum)
Please stop at the lab. We will schedule an appt if the above labs are normal.  Patient information (Up-to-Date): Collection of a 24-hour urine specimen  - You should collect every drop of urine during each 24-hour period. It does not matter how much or little urine is passed each time, as long as every drop is collected. - Begin the urine collection in the morning after you wake up, after you have emptied your bladder for the first time. - Urinate (empty the bladder) for the first time and flush it down the toilet. Note the exact time (eg, 6:15 AM). You will begin the urine collection at this time. - Collect every drop of urine during the day and night in an empty collection bottle. Store the bottle at room temperature or in the refrigerator. - If you need to have a bowel movement, any urine passed with the bowel movement should be collected. Try not to include feces with the urine collection. If feces does get mixed in, do not try to remove the feces from the urine collection bottle. - Finish by collecting the first urine passed the next morning, adding it to the collection bottle. This should be within ten minutes before or after the time of the first morning void on the first day (which was flushed). In this example, you would try to void between 6:05 and 6:25 on the second day. - If you need to urinate one hour before the final collection time, drink a full glass of water so that you can void again at the appropriate time. If you have to urinate 20 minutes before, try to hold the urine until the proper time. - Please note the exact time of the final collection, even if it is not the same time as when collection began on day 1. - The bottle(s) may be kept at room temperature for a day or two, but should be kept cool or refrigerated for longer periods of time.  Please consider the following ways to cut down carbs and fat and increase fiber and micronutrients in your diet:  - substitute whole grain  for white bread or pasta - substitute brown rice for white rice - substitute 90-calorie flat bread pieces for slices of bread when possible - substitute sweet potatoes or yams for white potatoes - substitute humus for margarine - substitute tofu for cheese when possible - substitute almond or rice milk for regular milk (would not drink soy milk daily due to concern for soy estrogen influence on breast cancer risk) - substitute dark chocolate for other sweets when possible - substitute water - can add lemon or orange slices for taste - for diet sodas (artificial sweeteners will trick your body that you can eat sweets without getting calories and will lead you to overeating and weight gain in the long run) - do not skip breakfast or other meals (this will slow down the metabolism and will result in more weight gain over time)  - can try smoothies made from fruit and almond/rice milk in am instead of regular breakfast - can also try old-fashioned (not instant) oatmeal made with almond/rice milk in am - order the dressing on the side when eating salad at a restaurant (pour less than half of the dressing on the salad) - eat as little meat as possible - can try juicing, but should not forget that juicing will get rid of the fiber, so would alternate with eating raw veg./fruits or drinking smoothies - use as little  oil as possible, even when using olive oil - can dress a salad with a mix of balsamic vinegar and lemon juice, for e.g. - use agave nectar, stevia sugar, or regular sugar rather than artificial sweateners - steam or broil/roast veggies  - snack on veggies/fruit/nuts (unsalted, preferably) when possible, rather than processed foods - reduce or eliminate aspartame in diet (it is in diet sodas, chewing gum, etc) Read the labels!  Try to read Dr. Janene Harvey book: "Program for Reversing Diabetes" for the vegan concept and other ideas for healthy eating.  Plant-based diet materials: -  Lectures (you tube):  Alyssa Grove: "Breaking the Food Seduction"  Doug Lisle: "How to Lose Weight, without Losing Your Mind"  Shari Heritage: "What is Insulin Resistance" https://www.woods-mathews.com/ - Documentaries:  [place] over [employer], Sick and Nearly Dead  The [employer]  Overweight and undernourished - Books:  Alyssa Grove: "Program for Reversing Diabetes"  Heath Gold: "The [place] Study"  Norma Fredrickson: "Supermarket Vegan" (cookbook) - Facebook pages:   Forks versus Knives  Vegucated  [employer]ters - Healthy nutrition info websites:  https://www.martin.info/

## 2014-08-21 ENCOUNTER — Telehealth: Payer: Self-pay | Admitting: Internal Medicine

## 2014-08-21 LAB — TESTOSTERONE, FREE, TOTAL, SHBG
SEX HORMONE BINDING: 29 nmol/L (ref 18–114)
TESTOSTERONE FREE: 3.8 pg/mL (ref 0.6–6.8)
Testosterone-% Free: 1.9 % (ref 0.4–2.4)
Testosterone: 20 ng/dL (ref 10–70)

## 2014-08-21 LAB — THYROID PEROXIDASE ANTIBODY

## 2014-08-21 LAB — ESTRADIOL, FREE

## 2014-08-21 NOTE — Telephone Encounter (Signed)
Please read note below and advise.  

## 2014-08-21 NOTE — Telephone Encounter (Signed)
Patient had wrong labs drawn, she needs Estradiol free, red top  Serum. A whole blood red top was sent and they can't use that.

## 2014-08-22 ENCOUNTER — Encounter: Payer: Self-pay | Admitting: *Deleted

## 2014-08-22 NOTE — Telephone Encounter (Signed)
No need to call her back to repeat as all the rest of the labs were normal.

## 2014-08-28 ENCOUNTER — Other Ambulatory Visit: Payer: PRIVATE HEALTH INSURANCE

## 2014-08-29 LAB — CREATININE, URINE, 24 HOUR
CREATININE 24H UR: 2680 mg/d — AB (ref 700–1800)
CREATININE, URINE: 167.5 mg/dL

## 2014-08-31 LAB — CORTISOL, URINE, 24 HOUR
Cortisol (Ur), Free: 61.9 mcg/24 h — ABNORMAL HIGH (ref 4.0–50.0)
RESULTS RECEIVED: 2.71 g/(24.h) — ABNORMAL HIGH (ref 0.63–2.50)

## 2014-09-04 MED ORDER — DEXAMETHASONE 1 MG PO TABS
ORAL_TABLET | ORAL | Status: DC
Start: 2014-09-04 — End: 2014-09-11

## 2014-09-05 ENCOUNTER — Telehealth: Payer: Self-pay | Admitting: Internal Medicine

## 2014-09-05 ENCOUNTER — Other Ambulatory Visit: Payer: Self-pay

## 2014-09-05 DIAGNOSIS — Z1231 Encounter for screening mammogram for malignant neoplasm of breast: Secondary | ICD-10-CM

## 2014-09-05 NOTE — Telephone Encounter (Signed)
Patient states that she spoke with Larene Beach  She states she just has a question about the medication    Please call patient back

## 2014-09-05 NOTE — Telephone Encounter (Signed)
Returned pt's call. Pt said that the orders on her rx was to take 2 tablets at 11:00 pm. She questioned the pharmacist. They said this was the order. I checked the order/rx in EPIC. It said to take 1 tablet at 11:00 pm, before coming in at 8:00 am the next morning for labs. Pt glad she called and verified the orders.

## 2014-09-06 ENCOUNTER — Other Ambulatory Visit (INDEPENDENT_AMBULATORY_CARE_PROVIDER_SITE_OTHER): Payer: PRIVATE HEALTH INSURANCE

## 2014-09-06 DIAGNOSIS — R635 Abnormal weight gain: Secondary | ICD-10-CM

## 2014-09-06 LAB — CORTISOL: Cortisol, Plasma: 3.1 ug/dL

## 2014-09-11 MED ORDER — DEXAMETHASONE 1 MG PO TABS: ORAL_TABLET | ORAL | Status: DC

## 2014-09-21 ENCOUNTER — Ambulatory Visit
Admission: RE | Admit: 2014-09-21 | Discharge: 2014-09-21 | Disposition: A | Discharge status: Home / Self Care | Payer: Commercial Managed Care - PPO | Source: Ambulatory Visit | Attending: Radiation Oncology | Admitting: Radiation Oncology

## 2014-09-21 ENCOUNTER — Encounter (INDEPENDENT_AMBULATORY_CARE_PROVIDER_SITE_OTHER): Payer: Self-pay

## 2014-09-21 ENCOUNTER — Ambulatory Visit
Admission: RE | Admit: 2014-09-21 | Discharge: 2014-09-21 | Disposition: A | Discharge status: Home / Self Care | Payer: Commercial Managed Care - PPO | Source: Ambulatory Visit

## 2014-09-21 ENCOUNTER — Other Ambulatory Visit: Payer: Self-pay | Admitting: Radiation Oncology

## 2014-09-21 DIAGNOSIS — C539 Malignant neoplasm of cervix uteri, unspecified: Secondary | ICD-10-CM

## 2014-09-21 DIAGNOSIS — Z1231 Encounter for screening mammogram for malignant neoplasm of breast: Secondary | ICD-10-CM

## 2016-03-05 ENCOUNTER — Other Ambulatory Visit: Payer: Self-pay

## 2016-03-05 DIAGNOSIS — Z1231 Encounter for screening mammogram for malignant neoplasm of breast: Secondary | ICD-10-CM

## 2016-03-20 ENCOUNTER — Ambulatory Visit
Admission: RE | Admit: 2016-03-20 | Discharge: 2016-03-20 | Disposition: A | Discharge status: Home / Self Care | Payer: BLUE CROSS/BLUE SHIELD | Source: Ambulatory Visit

## 2016-03-20 DIAGNOSIS — Z1231 Encounter for screening mammogram for malignant neoplasm of breast: Secondary | ICD-10-CM

## 2017-02-01 DIAGNOSIS — S8992XA Unspecified injury of left lower leg, initial encounter: Secondary | ICD-10-CM | POA: Diagnosis present

## 2017-02-01 DIAGNOSIS — Y939 Activity, unspecified: Secondary | ICD-10-CM | POA: Insufficient documentation

## 2017-02-01 DIAGNOSIS — Y999 Unspecified external cause status: Secondary | ICD-10-CM | POA: Diagnosis not present

## 2017-02-01 DIAGNOSIS — X501XXA Overexertion from prolonged static or awkward postures, initial encounter: Secondary | ICD-10-CM | POA: Insufficient documentation

## 2017-02-01 DIAGNOSIS — S8392XA Sprain of unspecified site of left knee, initial encounter: Secondary | ICD-10-CM | POA: Diagnosis not present

## 2017-02-01 DIAGNOSIS — Y929 Unspecified place or not applicable: Secondary | ICD-10-CM | POA: Diagnosis not present

## 2017-02-02 ENCOUNTER — Encounter (HOSPITAL_BASED_OUTPATIENT_CLINIC_OR_DEPARTMENT_OTHER): Payer: Self-pay

## 2017-02-02 ENCOUNTER — Emergency Department (HOSPITAL_BASED_OUTPATIENT_CLINIC_OR_DEPARTMENT_OTHER)
Admission: EM | Admit: 2017-02-02 | Discharge: 2017-02-02 | Disposition: A | Discharge status: Home / Self Care | Payer: Managed Care, Other (non HMO) | Attending: Emergency Medicine | Admitting: Emergency Medicine

## 2017-02-02 ENCOUNTER — Emergency Department (HOSPITAL_BASED_OUTPATIENT_CLINIC_OR_DEPARTMENT_OTHER): Payer: Managed Care, Other (non HMO)

## 2017-02-02 DIAGNOSIS — S8392XA Sprain of unspecified site of left knee, initial encounter: Secondary | ICD-10-CM

## 2017-02-02 HISTORY — DX: Vitamin D deficiency, unspecified: E55.9

## 2017-02-02 HISTORY — DX: Malignant (primary) neoplasm, unspecified: C80.1

## 2017-02-02 HISTORY — DX: Crohn's disease, unspecified, without complications: K50.90

## 2017-02-02 NOTE — Discharge Instructions (Signed)
Take acetaminophen and/or naproxen as needed for pain. Wear the knee immobilizer, use the crutches as needed.

## 2017-02-02 NOTE — ED Triage Notes (Signed)
Pt was sitting down and she turned to get something from behind her and her left knee started hurting on the lateral side of her knee.  Pt has been unable to bear weight on leg without assistance of a walking stick.

## 2017-02-02 NOTE — ED Provider Notes (Signed)
[facility] Provider Note   CSN: [id number] Arrival date & time: 02/01/17  2348     History   Chief Complaint Chief Complaint  Patient presents with  . Knee Pain    HPI Joan [name] is a 46 y.o. female.  She was getting up from a chair when she felt something pop in the lateral aspect of her left knee. Since then, she has had severe pain in the knee if she tries to move it. There is very little pain at all if it stays still. She denies any recent trauma. She has a history of Crohn's disease and has had pain in the other knee. She does relate that she had taken ballet for 13 years but had not had any new problems during that time.   The history is provided by the patient.    Past Medical History:  Diagnosis Date  . Abnormal Pap smear   . Anemia   . Cancer [facility]) 2013   cervical   . Crohn's disease ([place])   . Vitamin D deficiency     Patient Active Problem List   Diagnosis Date Noted  . Weight gain 08/20/2014    Past Surgical History:  Procedure Laterality Date  . ABDOMINAL HYSTERECTOMY  2013  . CERVICAL CONE BIOPSY    . LEEP  02/22/2012   Procedure: LOOP ELECTROSURGICAL EXCISION PROCEDURE (LEEP);  Surgeon: Thurnell Lose, MD;  [facility];  Service: Gynecology;  Laterality: N/A;  . TYMPANOSTOMY TUBE PLACEMENT  AGE 49  . WISDOM TOOTH EXTRACTION  ORAL SURG OFFICE--  1998    OB History    No data available       Home Medications    Prior to Admission medications   Medication Sig Start Date End Date Taking? Authorizing Provider  mesalamine (PENTASA) 500 MG CR capsule Take 500 mg by mouth 2 (two) times daily.    Yes Historical Provider, MD  Vitamin D, Ergocalciferol, (DRISDOL) 50000 units CAPS capsule Take 50,000 Units by mouth every 7 (seven) days.   Yes Historical Provider, MD  Multiple Vitamins-Iron (MULTIVITAMIN/IRON PO) Take 1 tablet by mouth daily.    Historical Provider, MD  progesterone (PROMETRIUM) 100 MG capsule Take  100 mg by mouth daily.    Historical Provider, MD    Family History No family history on file.  Social History Social History  Substance Use Topics  . Smoking status: Never Smoker  . Smokeless tobacco: Never Used  . Alcohol use No     Allergies   Patient has no known allergies.   Review of Systems Review of Systems  All other systems reviewed and are negative.    Physical Exam Updated Vital Signs BP 125/73 (BP Location: Right Arm)   Pulse 94   Temp 97.8 F (36.6 C) (Oral)   Resp 18   Ht 5' 10"  (1.778 m)   Wt 300 lb (136.1 kg)   LMP 01/28/2012   SpO2 100%   BMI 43.05 kg/m   Physical Exam  Nursing note and vitals reviewed.  Obese 46 year old female, resting comfortably and in no acute distress. Vital signs are normal. Oxygen saturation is 100%, which is normal. Head is normocephalic and atraumatic. PERRLA, EOMI. Oropharynx is clear. Neck is nontender and supple without adenopathy or JVD. Back is nontender and there is no CVA tenderness. Lungs are clear without rales, wheezes, or rhonchi. Chest is nontender. Heart has regular rate and rhythm without murmur. Abdomen is soft, flat, nontender  without masses or hepatosplenomegaly and peristalsis is normoactive. Extremities have no cyanosis or edema. There is localized tenderness along the lateral aspect of the left knee. There is no significant swelling or effusion. There is pain on stress of the lateral collateral ligament but no pain on stress of the medial collateral ligament. Lachman and McMurray tests unable to be done because of pain with movement. Skin is warm and dry without rash. Neurologic: Mental status is normal, cranial nerves are intact, there are no motor or sensory deficits.   ED Treatments / Results   Radiology Dg Knee Complete 4 Views Left  Result Date: 02/02/2017 CLINICAL DATA:  Twisting injury with pain to the lateral side of the left knee. Unable to bear weight or straighten the left knee.  EXAM: LEFT KNEE - COMPLETE 4+ VIEW COMPARISON:  None. FINDINGS: Degenerative changes in the left knee with lateral greater than medial compartment narrowing and osteophyte formation in all 3 compartments. No evidence of acute fracture or dislocation. No focal bone lesion or bone destruction. No significant effusion. Soft tissues are unremarkable. IMPRESSION: Tricompartment degenerative changes in the left knee. No acute bony abnormalities. Electronically Signed   By: Lucienne Capers M.D.   On: 02/02/2017 00:46    Procedures Procedures (including critical care time)  Medications Ordered in ED Medications - No data to display   Initial Impression / Assessment and Plan / ED Course  I have reviewed the triage vital signs and the nursing notes.  Pertinent imaging results that were available during my care of the patient were reviewed by me and considered in my medical decision making (see chart for details).  Left knee injury which could be ligamentous or meniscus. X-ray shows significant degenerative changes. Old records are reviewed, and she has no relevant past visits. She is placed in a knee immobilizer for comfort and given crutches to use as needed. Because of Crohn's disease, she is unable to use many NSAIDs but is able to take naproxen. She is advised on ice and elevation and told to use naproxen and acetaminophen as needed for pain. She is referred to orthopedics for follow-up.  Final Clinical Impressions(s) / ED Diagnoses   Final diagnoses:  Sprain of left knee, unspecified ligament, initial encounter    New Prescriptions Current Discharge Medication List       Delora Fuel, MD 63/01/60 1093

## 2017-08-30 ENCOUNTER — Other Ambulatory Visit: Payer: Self-pay | Admitting: Family Medicine

## 2017-08-30 ENCOUNTER — Ambulatory Visit
Admission: RE | Admit: 2017-08-30 | Discharge: 2017-08-30 | Disposition: A | Discharge status: Home / Self Care | Payer: BLUE CROSS/BLUE SHIELD | Source: Ambulatory Visit | Attending: Family Medicine | Admitting: Family Medicine

## 2017-08-30 DIAGNOSIS — Z0000 Encounter for general adult medical examination without abnormal findings: Secondary | ICD-10-CM

## 2018-08-31 ENCOUNTER — Other Ambulatory Visit: Payer: Self-pay | Admitting: Family Medicine

## 2018-08-31 DIAGNOSIS — Z1231 Encounter for screening mammogram for malignant neoplasm of breast: Secondary | ICD-10-CM

## 2018-09-02 ENCOUNTER — Other Ambulatory Visit: Payer: Self-pay | Admitting: Gastroenterology

## 2018-09-02 DIAGNOSIS — K5010 Crohn's disease of large intestine without complications: Secondary | ICD-10-CM

## 2018-09-19 ENCOUNTER — Other Ambulatory Visit: Payer: BLUE CROSS/BLUE SHIELD

## 2018-09-23 ENCOUNTER — Ambulatory Visit: Payer: BLUE CROSS/BLUE SHIELD

## 2018-09-23 ENCOUNTER — Ambulatory Visit
Admission: RE | Admit: 2018-09-23 | Discharge: 2018-09-23 | Disposition: A | Discharge status: Home / Self Care | Payer: Managed Care, Other (non HMO) | Source: Ambulatory Visit | Attending: Family Medicine | Admitting: Family Medicine

## 2018-09-23 ENCOUNTER — Ambulatory Visit
Admission: RE | Admit: 2018-09-23 | Discharge: 2018-09-23 | Disposition: A | Discharge status: Home / Self Care | Payer: BLUE CROSS/BLUE SHIELD | Source: Ambulatory Visit | Attending: Gastroenterology | Admitting: Gastroenterology

## 2018-09-23 DIAGNOSIS — Z1231 Encounter for screening mammogram for malignant neoplasm of breast: Secondary | ICD-10-CM

## 2018-09-23 DIAGNOSIS — K5010 Crohn's disease of large intestine without complications: Secondary | ICD-10-CM

## 2018-09-23 MED ORDER — IOPAMIDOL (ISOVUE-300) INJECTION 61%
125.0000 mL | Freq: Once | INTRAVENOUS | Status: AC | PRN
  Administered 2018-09-23: 125 mL via INTRAVENOUS

## 2018-12-06 ENCOUNTER — Encounter (INDEPENDENT_AMBULATORY_CARE_PROVIDER_SITE_OTHER): Payer: Self-pay

## 2018-12-28 ENCOUNTER — Ambulatory Visit (INDEPENDENT_AMBULATORY_CARE_PROVIDER_SITE_OTHER): Payer: BLUE CROSS/BLUE SHIELD | Admitting: Bariatrics

## 2018-12-28 ENCOUNTER — Encounter (INDEPENDENT_AMBULATORY_CARE_PROVIDER_SITE_OTHER): Payer: Self-pay | Admitting: Bariatrics

## 2018-12-28 VITALS — BP 114/76 | HR 80 | Temp 97.9°F | Ht 69.0 in | Wt 324.0 lb

## 2018-12-28 DIAGNOSIS — R0602 Shortness of breath: Secondary | ICD-10-CM

## 2018-12-28 DIAGNOSIS — Z9189 Other specified personal risk factors, not elsewhere classified: Secondary | ICD-10-CM | POA: Diagnosis not present

## 2018-12-28 DIAGNOSIS — Z0289 Encounter for other administrative examinations: Secondary | ICD-10-CM

## 2018-12-28 DIAGNOSIS — R5383 Other fatigue: Secondary | ICD-10-CM | POA: Diagnosis not present

## 2018-12-28 DIAGNOSIS — K50919 Crohn's disease, unspecified, with unspecified complications: Secondary | ICD-10-CM

## 2018-12-28 DIAGNOSIS — E559 Vitamin D deficiency, unspecified: Secondary | ICD-10-CM | POA: Diagnosis not present

## 2018-12-28 DIAGNOSIS — Z1331 Encounter for screening for depression: Secondary | ICD-10-CM

## 2018-12-29 DIAGNOSIS — K50919 Crohn's disease, unspecified, with unspecified complications: Secondary | ICD-10-CM | POA: Insufficient documentation

## 2018-12-29 DIAGNOSIS — Z6841 Body Mass Index (BMI) 40.0 and over, adult: Secondary | ICD-10-CM

## 2018-12-29 LAB — COMPREHENSIVE METABOLIC PANEL
A/G RATIO: 1.2 (ref 1.2–2.2)
ALK PHOS: 77 IU/L (ref 39–117)
ALT: 22 IU/L (ref 0–32)
AST: 22 IU/L (ref 0–40)
Albumin: 4 g/dL (ref 3.5–5.5)
BUN / CREAT RATIO: 10 (ref 9–23)
BUN: 7 mg/dL (ref 6–24)
Bilirubin Total: 0.3 mg/dL (ref 0.0–1.2)
CO2: 25 mmol/L (ref 20–29)
Calcium: 9.2 mg/dL (ref 8.7–10.2)
Chloride: 101 mmol/L (ref 96–106)
Creatinine, Ser: 0.71 mg/dL (ref 0.57–1.00)
GFR calc Af Amer: 118 mL/min/{1.73_m2} (ref >59)
GFR calc non Af Amer: 102 mL/min/{1.73_m2} (ref >59)
GLOBULIN, TOTAL: 3.3 g/dL (ref 1.5–4.5)
Glucose: 86 mg/dL (ref 65–99)
POTASSIUM: 4.3 mmol/L (ref 3.5–5.2)
SODIUM: 138 mmol/L (ref 134–144)
Total Protein: 7.3 g/dL (ref 6.0–8.5)

## 2018-12-29 LAB — TSH: TSH: 4.1 u[IU]/mL (ref 0.450–4.500)

## 2018-12-29 LAB — FOLATE: FOLATE: 10 ng/mL (ref >3.0)

## 2018-12-29 LAB — INSULIN, RANDOM: INSULIN: 18.9 u[IU]/mL (ref 2.6–24.9)

## 2018-12-29 LAB — T4, FREE: Free T4: 1 ng/dL (ref 0.82–1.77)

## 2018-12-29 LAB — VITAMIN D 25 HYDROXY (VIT D DEFICIENCY, FRACTURES): Vit D, 25-Hydroxy: 11 ng/mL — ABNORMAL LOW (ref 30.0–100.0)

## 2018-12-29 LAB — VITAMIN B12: Vitamin B-12: 648 pg/mL (ref 232–1245)

## 2018-12-29 LAB — HEMOGLOBIN A1C
Est. average glucose Bld gHb Est-mCnc: 117 mg/dL
Hgb A1c MFr Bld: 5.7 % — ABNORMAL HIGH (ref 4.8–5.6)

## 2018-12-29 LAB — T3: T3, Total: 125 ng/dL (ref 71–180)

## 2018-12-29 NOTE — Progress Notes (Signed)
Office: [phone]  /  Fax: [phone]   Dear Dr. Laurann [name],   Thank you for referring Joan [name] to our clinic. The following note includes my evaluation and treatment recommendations.  HPI:   Chief Complaint: Joan [name] has been referred by Joan Pillar, MD for consultation regarding her obesity and obesity related comorbidities.    Joan [name] (MR# [id number]) is a 48 y.o. female who presents on 12/29/2018 for obesity evaluation and treatment. Current BMI is Body mass index is 47.85 kg/m.Marland Kitchen Joan [name] has been struggling with her weight for many years and has been unsuccessful in either losing weight, maintaining weight loss, or reaching her healthy weight goal.     Joan [name] attended our information session and states she is currently in the action stage of change and ready to dedicate time achieving and maintaining a healthier weight. Joan [name] is interested in becoming our patient and working on intensive lifestyle modifications including (but not limited to) diet, exercise and weight loss.     Joan [name] states her family eats meals together her desired weight is less than 200 lbs she started gaining weight in her late 20's, early 30's post surgery in 2013 her heaviest weight ever was 324 lbs. she states that her obstacles to cooking are getting home too late she has significant food cravings issues  she does snack at night she skips meals frequently she is frequently drinking liquids with calories she sometimes makes poor food choices she has problems with excessive hunger  she eats larger portions than normal at times she has binge eating behaviors she struggles with emotional eating Joan [name] has crohn's disease and she is lactose intolerant    Fatigue Joan [name] feels her energy is lower than it should be. This has worsened with weight gain and has not worsened recently. Joan [name] admits to daytime somnolence and she admits to waking up still tired. Patient is at  risk for obstructive sleep apnea. Patent has a history of symptoms of daytime fatigue, morning fatigue and morning headache. Patient generally gets 4 to 6 hours of sleep per night, and states they generally have difficulty falling asleep. Snoring is present. Apneic episodes are not present. Epworth Sleepiness Score is 10  Dyspnea on exertion Joan [name] notes increasing shortness of breath with certain activities and seems to be worsening over time with weight gain. She notes getting out of breath sooner with activity (walking out) than she used to. This has not gotten worse recently. Joan [name] denies orthopnea.  Vitamin D deficiency Joan [name] has a diagnosis of vitamin D deficiency. She is currently taking vit D and denies nausea, vomiting or muscle weakness.  At risk for osteopenia and osteoporosis Joan [name] is at higher risk of osteopenia and osteoporosis due to vitamin D deficiency.   Crohn's Disease Joan [name] has a diagnosis of crohn's disease and she is currently taking Pentasa.  Depression Screen Joan [name]'s Food and Mood (modified PHQ-9) score was  Depression screen PHQ 2/9 12/28/2018  Decreased Interest 1  Down, Depressed, Hopeless 1  PHQ - 2 Score 2  Altered sleeping 0  Tired, decreased energy 1  Change in appetite 1  Feeling bad or failure about yourself  2  Trouble concentrating 1  Moving slowly or fidgety/restless 0  Suicidal thoughts 0  PHQ-9 Score 7  Difficult doing work/chores Not difficult at all    ASSESSMENT AND PLAN:  Other fatigue - Plan: EKG 12-Lead, EKG 12-Lead, Comprehensive metabolic panel, Hemoglobin A1c, Insulin, random, Vitamin B12, Folate, T3, T4,  free, TSH  Shortness of breath on exertion  Vitamin D deficiency - Plan: VITAMIN D 25 Hydroxy (Vit-D Deficiency, Fractures)  Depression screening  At risk for osteoporosis  Crohn's disease with complication, unspecified gastrointestinal tract location ([place]) - Plan: Vitamin B12, Folate  PLAN:  Fatigue Joan [name] was  informed that her fatigue may be related to obesity, depression or many other causes. Labs will be ordered, and in the meanwhile Joan [name] has agreed to work on diet, exercise and weight loss to help with fatigue. Proper sleep hygiene was discussed including the need for 7-8 hours of quality sleep each night. A sleep study was not ordered based on symptoms and Epworth score.  Dyspnea on exertion Joan [name]'s shortness of breath appears to be obesity related and exercise induced. She has agreed to work on weight loss and gradually increase exercise to treat her exercise induced shortness of breath. If Joan [name] follows our instructions and loses weight without improvement of her shortness of breath, we will plan to refer to pulmonology. We will monitor this condition regularly. Joan [name] agrees to this plan.  Vitamin D Deficiency Joan [name] was informed that low vitamin D levels contributes to fatigue and are associated with obesity, breast, and colon cancer. She will continue to take prescription vitamin D and will follow up for routine testing of vitamin D, at least 2-3 times per year. She was informed of the risk of over-replacement of vitamin D and agrees to not increase her dose unless she discusses this with [name] first. We will check vitamin D level and Joan [name] will follow up as directed.  At risk for osteopenia and osteoporosis Joan [name] was given extended  (15 minutes) osteoporosis prevention counseling today. Joan [name] is at risk for osteopenia and osteoporosis due to her vitamin D deficiency. She was encouraged to take her vitamin D and follow her higher calcium diet and increase strengthening exercise to help strengthen her bones and decrease her risk of osteopenia and osteoporosis.  Crohn's Disease Joan [name] will continue Pentasa as prescribed and we will modify her diet to decrease trigger foods. Joan [name] agreed to follow up with our clinic in 2 weeks.  Depression Screen Joan [name] had a mildly positive depression  screening. Depression is commonly associated with obesity and often results in emotional eating behaviors. We will monitor this closely and work on CBT to help improve the non-hunger eating patterns. Referral to Psychology may be required if no improvement is seen as she continues in our clinic.  Obesity Joan [name] is currently in the action stage of change and her goal is to continue with weight loss efforts. I recommend Joan [name] begin the structured treatment plan as follows:  She has agreed to follow the Category 3 plan Joan [name] has been instructed to eventually work up to a goal of 150 minutes of combined cardio and strengthening exercise per week for weight loss and overall health benefits. We discussed the following Behavioral Modification Strategies today: increase H2O intake, keeping healthy foods in the home, better snacking choices, increasing lean protein intake, decreasing simple carbohydrates , increasing vegetables and work on meal planning and easy cooking plans   She was informed of the importance of frequent follow up visits to maximize her success with intensive lifestyle modifications for her multiple health conditions. She was informed we would discuss her lab results at her next visit unless there is a critical issue that needs to be addressed sooner. Joan [name] agreed to keep her next visit at the agreed upon time to discuss these results.  ALLERGIES: No  Known Allergies  MEDICATIONS: Current Outpatient Medications on File Prior to Visit  Medication Sig Dispense Refill  . dicyclomine (BENTYL) 20 MG tablet Take 20 mg by mouth as needed for spasms.    . mesalamine (PENTASA) 500 MG CR capsule Take 500 mg by mouth 2 (two) times daily.     . Vitamin D, Ergocalciferol, (DRISDOL) 50000 units CAPS capsule Take 50,000 Units by mouth every 7 (seven) days.    . Multiple Vitamins-Iron (MULTIVITAMIN/IRON PO) Take 1 tablet by mouth daily.    . progesterone (PROMETRIUM) 100 MG capsule Take 100 mg by  mouth daily.     No current facility-administered medications on file prior to visit.     PAST MEDICAL HISTORY: Past Medical History:  Diagnosis Date  . Abnormal Pap smear   . Anemia   . Back pain   . Cancer [name]) 2013   cervical   . Crohn's disease ([place])   . Joint pain   . Lactose intolerance   . Vitamin D deficiency     PAST SURGICAL HISTORY: Past Surgical History:  Procedure Laterality Date  . ABDOMINAL HYSTERECTOMY  2013  . CERVICAL CONE BIOPSY    . HYSTEROSCOPY    . LEEP  02/22/2012   Procedure: LOOP ELECTROSURGICAL EXCISION PROCEDURE (LEEP);  Surgeon: Thurnell Lose, MD;  [facility];  Service: Gynecology;  Laterality: N/A;  . MYRINGOTOMY    . TYMPANOSTOMY TUBE PLACEMENT  AGE 52  . WISDOM TOOTH EXTRACTION  ORAL SURG OFFICE--  1998    SOCIAL HISTORY: Social History   Tobacco Use  . Smoking status: Never Smoker  . Smokeless tobacco: Never Used  Substance Use Topics  . Alcohol use: No  . Drug use: No    FAMILY HISTORY: Family History  Problem Relation Age of Onset  . Obesity Mother   . Diabetes Father   . High blood pressure Father   . Kidney disease Father     ROS: Review of Systems  Constitutional: Positive for malaise/fatigue.  HENT: Positive for congestion (nasal stuffiness).   Eyes:       + Wear Glasses or Contacts  Respiratory: Positive for shortness of breath (with activity).   Cardiovascular: Negative for orthopnea.  Gastrointestinal: Negative for nausea and vomiting.  Musculoskeletal: Positive for back pain.       Negative for muscle weakness  Neurological: Positive for headaches.  Psychiatric/Behavioral: The patient has insomnia.     PHYSICAL EXAM: Blood pressure 114/76, pulse 80, temperature 97.9 F (36.6 C), temperature source Oral, height 5' 9"  (1.753 m), weight (!) 324 lb (147 kg), last menstrual period 08/21/2012, SpO2 100 %. Body mass index is 47.85 kg/m. Physical Exam Vitals signs reviewed.    Constitutional:      Appearance: Normal appearance. She is well-developed. She is obese.  HENT:     Head: Normocephalic and atraumatic.     Nose: Nose normal.  Eyes:     General: No scleral icterus.    Extraocular Movements: Extraocular movements intact.  Neck:     Musculoskeletal: Normal range of motion and neck supple.     Thyroid: No thyromegaly.  Cardiovascular:     Rate and Rhythm: Normal rate and regular rhythm.  Pulmonary:     Effort: Pulmonary effort is normal. No respiratory distress.  Abdominal:     Palpations: Abdomen is soft.     Tenderness: There is no abdominal tenderness.  Musculoskeletal: Normal range of motion.     Comments: Range of Motion  normal in all 4 extremities  Skin:    General: Skin is warm and dry.  Neurological:     Mental Status: She is alert and oriented to person, place, and time.     Coordination: Coordination normal.  Psychiatric:        Mood and Affect: Mood normal.        Behavior: Behavior normal.     RECENT LABS AND TESTS: BMET No results found for: NA, K, CL, CO2, GLUCOSE, BUN, CREATININE, CALCIUM, GFRNONAA, GFRAA No results found for: HGBA1C No results found for: INSULIN CBC    Component Value Date/Time   HGB 11.7 (L) 02/22/2012 1000   Iron/TIBC/Ferritin/ %Sat No results found for: IRON, TIBC, FERRITIN, IRONPCTSAT Lipid Panel  No results found for: CHOL, TRIG, HDL, CHOLHDL, VLDL, LDLCALC, LDLDIRECT Hepatic Function Panel  No results found for: PROT, ALBUMIN, AST, ALT, ALKPHOS, BILITOT, BILIDIR, IBILI    Component Value Date/Time   TSH 2.56 08/20/2014 1030   Vitamin D There are no recent lab results  ECG  shows NSR with a rate of 66 BPM INDIRECT CALORIMETER done today shows a VO2 of 415 and a REE of 2885.  Her calculated basal metabolic rate is 3545 thus her basal metabolic rate is better than expected.       OBESITY BEHAVIORAL INTERVENTION VISIT  Today's visit was # 1   Starting weight: 324 lbs Starting date:  12/28/2017 Today's weight : 324 lbs Today's date: 12/28/2018 Total lbs lost to date: 0   ASK: We discussed the diagnosis of obesity with Joan [name] today and [facility] agreed to give [name] permission to discuss obesity behavioral modification therapy today.  ASSESS: Cherree has the diagnosis of obesity and her BMI today is 65.82 Yelitza is in the action stage of change   ADVISE: Terilynn was educated on the multiple health risks of obesity as well as the benefit of weight loss to improve her health. She was advised of the need for long term treatment and the importance of lifestyle modifications to improve her current health and to decrease her risk of future health problems.  AGREE: Multiple dietary modification options and treatment options were discussed and  Timya agreed to follow the recommendations documented in the above note.  ARRANGE: Sharmila was educated on the importance of frequent visits to treat obesity as outlined per CMS and USPSTF guidelines and agreed to schedule her next follow up appointment today.  Corey Skains, am acting as Location manager for [employer]. Owens Shark, DO  I have reviewed the above documentation for accuracy and completeness, and I agree with the above. -Jearld Lesch, DO

## 2019-01-11 ENCOUNTER — Encounter (INDEPENDENT_AMBULATORY_CARE_PROVIDER_SITE_OTHER): Payer: Self-pay | Admitting: Bariatrics

## 2019-01-11 ENCOUNTER — Ambulatory Visit (INDEPENDENT_AMBULATORY_CARE_PROVIDER_SITE_OTHER): Payer: BLUE CROSS/BLUE SHIELD | Admitting: Bariatrics

## 2019-01-11 VITALS — BP 108/71 | HR 68 | Temp 98.1°F | Ht 69.0 in | Wt 320.0 lb

## 2019-01-11 DIAGNOSIS — Z6841 Body Mass Index (BMI) 40.0 and over, adult: Secondary | ICD-10-CM | POA: Diagnosis not present

## 2019-01-11 DIAGNOSIS — R7303 Prediabetes: Secondary | ICD-10-CM | POA: Diagnosis not present

## 2019-01-11 DIAGNOSIS — E559 Vitamin D deficiency, unspecified: Secondary | ICD-10-CM | POA: Diagnosis not present

## 2019-01-12 NOTE — Progress Notes (Signed)
Office: [phone]  /  Fax: [phone]   HPI:   Chief Complaint: OBESITY Joan [name] is here to discuss her progress with her obesity treatment plan. She is on the Category 3 plan and is following her eating plan approximately 80 % of the time. She states she is exercising 0 minutes 0 times per week. Joan [name] is doing well overall on the plan. She noticed that she is eating "a lot". The plan got easier over time. She has had appropriate hunger. Joan [name] had a craving for pizza and occasional sweets. Her weight is (!) 320 lb (145.2 kg) today and has had a weight loss of 4 pounds over a period of 2 weeks since her last visit. She has lost 4 lbs since starting treatment with [name].  Vitamin D deficiency Joan [name] has a diagnosis of vitamin D deficiency. Joan [name] has not started vit D and she denies nausea, vomiting or muscle weakness.  Pre-Diabetes Joan [name] has a diagnosis of prediabetes based on her elevated Hgb A1c and was informed this puts her at greater risk of developing diabetes. Her last A1c was at 5.7 and last insulin level was at 18.9 She is not taking medications currently and continues to work on diet and exercise to decrease risk of diabetes.   ASSESSMENT AND PLAN:  Vitamin D deficiency  Prediabetes  Class 3 severe obesity with serious comorbidity and body mass index (BMI) of 45.0 to 49.9 in adult, unspecified obesity type ([place])  PLAN:  Vitamin D Deficiency Joan [name] was informed that low vitamin D levels contributes to fatigue and are associated with obesity, breast, and colon cancer. She will begin taking high dose Vit D (has prescription at home)  Twice weekly and will follow up for routine testing of vitamin D, at least 2-3 times per year. She was informed of the risk of over-replacement of vitamin D and agrees to not increase her dose unless she discusses this with [name] first.  Pre-Diabetes Joan [name] will continue to work on weight loss, exercise, and decreasing simple carbohydrates in her  diet to help decrease the risk of diabetes. She was informed that eating too many simple carbohydrates or too many calories at one sitting increases the likelihood of GI side effects. Joan [name] agreed to follow up with [name] as directed to monitor her progress.  I spent > than 50% of the 15 minute visit on counseling as documented in the note.  Obesity Joan [name] is currently in the action stage of change. As such, her goal is to continue with weight loss efforts She has agreed to follow the Category 3 plan Joan [name] has been instructed to work up to a goal of 150 minutes of combined cardio and strengthening exercise per week for weight loss and overall health benefits. We discussed the following Behavioral Modification Strategies today: increase H2O intake, keeping healthy foods in the home, increasing lean protein intake, decreasing simple carbohydrates, increasing vegetables and work on meal planning and easy cooking plans Joan [name] will space out her protein. She will have no snacks in the house that she considers "bad".  Joan [name] has agreed to follow up with our clinic in 2 weeks. She was informed of the importance of frequent follow up visits to maximize her success with intensive lifestyle modifications for her multiple health conditions.  ALLERGIES: No Known Allergies  MEDICATIONS: Current Outpatient Medications on File Prior to Visit  Medication Sig Dispense Refill  . dicyclomine (BENTYL) 20 MG tablet Take 20 mg by mouth as needed for spasms.    Marland Kitchen  mesalamine (PENTASA) 500 MG CR capsule Take 500 mg by mouth 2 (two) times daily.     . Vitamin D, Ergocalciferol, (DRISDOL) 50000 units CAPS capsule Take 50,000 Units by mouth every 7 (seven) days.     No current facility-administered medications on file prior to visit.     PAST MEDICAL HISTORY: Past Medical History:  Diagnosis Date  . Abnormal Pap smear   . Anemia   . Back pain   . Cancer [facility]) 2013   cervical   . Crohn's disease ([place])   . Joint  pain   . Lactose intolerance   . Vitamin D deficiency     PAST SURGICAL HISTORY: Past Surgical History:  Procedure Laterality Date  . ABDOMINAL HYSTERECTOMY  2013  . CERVICAL CONE BIOPSY    . HYSTEROSCOPY    . LEEP  02/22/2012   Procedure: LOOP ELECTROSURGICAL EXCISION PROCEDURE (LEEP);  Surgeon: Thurnell Lose, MD;  Location: [facility];  Service: Gynecology;  Laterality: N/A;  . MYRINGOTOMY    . TYMPANOSTOMY TUBE PLACEMENT  AGE 17  . WISDOM TOOTH EXTRACTION  ORAL SURG OFFICE--  1998    SOCIAL HISTORY: Social History   Tobacco Use  . Smoking status: Never Smoker  . Smokeless tobacco: Never Used  Substance Use Topics  . Alcohol use: No  . Drug use: No    FAMILY HISTORY: Family History  Problem Relation Age of Onset  . Obesity Mother   . Diabetes Father   . High blood pressure Father   . Kidney disease Father     ROS: Review of Systems  Constitutional: Positive for weight loss.  Gastrointestinal: Negative for nausea and vomiting.  Musculoskeletal:       Negative for muscle weakness    PHYSICAL EXAM: Blood pressure 108/71, pulse 68, temperature 98.1 F (36.7 C), temperature source Oral, height 5' 9"  (1.753 m), weight (!) 320 lb (145.2 kg), last menstrual period 08/21/2012, SpO2 98 %. Body mass index is 47.26 kg/m. Physical Exam Vitals signs reviewed.  Constitutional:      Appearance: Normal appearance. She is well-developed. She is obese.  Cardiovascular:     Rate and Rhythm: Normal rate.  Pulmonary:     Effort: Pulmonary effort is normal.  Musculoskeletal: Normal range of motion.  Skin:    General: Skin is warm and dry.  Neurological:     Mental Status: She is alert and oriented to person, place, and time.  Psychiatric:        Mood and Affect: Mood normal.        Behavior: Behavior normal.     RECENT LABS AND TESTS: BMET    Component Value Date/Time   NA 138 12/28/2018 1219   K 4.3 12/28/2018 1219   CL 101 12/28/2018 1219   CO2  25 12/28/2018 1219   GLUCOSE 86 12/28/2018 1219   BUN 7 12/28/2018 1219   CREATININE 0.71 12/28/2018 1219   CALCIUM 9.2 12/28/2018 1219   GFRNONAA 102 12/28/2018 1219   GFRAA 118 12/28/2018 1219   Lab Results  Component Value Date   HGBA1C 5.7 (H) 12/28/2018   Lab Results  Component Value Date   INSULIN 18.9 12/28/2018   CBC    Component Value Date/Time   HGB 11.7 (L) 02/22/2012 1000   Iron/TIBC/Ferritin/ %Sat No results found for: IRON, TIBC, FERRITIN, IRONPCTSAT Lipid Panel  No results found for: CHOL, TRIG, HDL, CHOLHDL, VLDL, LDLCALC, LDLDIRECT Hepatic Function Panel     Component Value Date/Time  PROT 7.3 12/28/2018 1219   ALBUMIN 4.0 12/28/2018 1219   AST 22 12/28/2018 1219   ALT 22 12/28/2018 1219   ALKPHOS 77 12/28/2018 1219   BILITOT 0.3 12/28/2018 1219      Component Value Date/Time   TSH 4.100 12/28/2018 1219   TSH 2.56 08/20/2014 1030     Ref. Range 12/28/2018 12:19  Vitamin D, 25-Hydroxy Latest Ref Range: 30.0 - 100.0 ng/mL 11.0 (L)     OBESITY BEHAVIORAL INTERVENTION VISIT  Today's visit was # 2   Starting weight: 324 lbs Starting date: 12/28/2018 Today's weight : 320 lbs Today's date: 01/11/2019 Total lbs lost to date: 4   ASK: We discussed the diagnosis of obesity with Sheran Spine today and [facility] agreed to give [name] permission to discuss obesity behavioral modification therapy today.  ASSESS: Sayre has the diagnosis of obesity and her BMI today is 47.23 Shandelle is in the action stage of change   ADVISE: Kamyah was educated on the multiple health risks of obesity as well as the benefit of weight loss to improve her health. She was advised of the need for long term treatment and the importance of lifestyle modifications to improve her current health and to decrease her risk of future health problems.  AGREE: Multiple dietary modification options and treatment options were discussed and  Jolynn agreed to follow the recommendations  documented in the above note.  ARRANGE: Haydan was educated on the importance of frequent visits to treat obesity as outlined per CMS and USPSTF guidelines and agreed to schedule her next follow up appointment today.  Corey Skains, am acting as Location manager for [employer]. Owens Shark, DO

## 2019-01-16 ENCOUNTER — Encounter (INDEPENDENT_AMBULATORY_CARE_PROVIDER_SITE_OTHER): Payer: Self-pay | Admitting: Bariatrics

## 2019-01-16 DIAGNOSIS — E559 Vitamin D deficiency, unspecified: Secondary | ICD-10-CM | POA: Insufficient documentation

## 2019-01-16 DIAGNOSIS — R7303 Prediabetes: Secondary | ICD-10-CM | POA: Insufficient documentation

## 2019-01-30 ENCOUNTER — Ambulatory Visit (INDEPENDENT_AMBULATORY_CARE_PROVIDER_SITE_OTHER): Payer: BLUE CROSS/BLUE SHIELD | Admitting: Bariatrics

## 2019-01-30 ENCOUNTER — Encounter (INDEPENDENT_AMBULATORY_CARE_PROVIDER_SITE_OTHER): Payer: Self-pay | Admitting: Bariatrics

## 2019-01-30 VITALS — BP 120/76 | HR 79 | Temp 98.4°F | Ht 69.0 in | Wt 315.0 lb

## 2019-01-30 DIAGNOSIS — Z6841 Body Mass Index (BMI) 40.0 and over, adult: Secondary | ICD-10-CM

## 2019-01-30 DIAGNOSIS — E559 Vitamin D deficiency, unspecified: Secondary | ICD-10-CM | POA: Diagnosis not present

## 2019-01-30 DIAGNOSIS — R7303 Prediabetes: Secondary | ICD-10-CM | POA: Diagnosis not present

## 2019-01-31 NOTE — Progress Notes (Signed)
Office: [phone]  /  Fax: [phone]   HPI:   Chief Complaint: OBESITY Joan [name] is here to discuss her progress with her obesity treatment plan. She is on the Category 3 plan and is following her eating plan approximately 85 % of the time. She states she is exercising 0 minutes 0 times per week. Joan [name] is doing well overall, despite stress. Her weekends are a struggle (busy activities). She is trying to get her breakfast. Her weight is (!) 315 lb (142.9 kg) today and has had a weight loss of 5 pounds over a period of 3 weeks since her last visit. She has lost 9 lbs since starting treatment with [name].  Vitamin D deficiency Joan [name] has a diagnosis of vitamin D deficiency. She is currently taking vit D and denies nausea, vomiting or muscle weakness.  Pre-Diabetes Joan [name] has a diagnosis of prediabetes based on her elevated Hgb A1c and was informed this puts her at greater risk of developing diabetes. Her last A1c was at 5.7 and last insulin level was at 18.9 She is not taking medications currently and continues to work on diet and exercise to decrease risk of diabetes. She denies polyphagia.  ASSESSMENT AND PLAN:  Vitamin D deficiency  Prediabetes  Class 3 severe obesity with serious comorbidity and body mass index (BMI) of 45.0 to 49.9 in adult, unspecified obesity type ([place])  PLAN:  Vitamin D Deficiency Joan [name] was informed that low vitamin D levels contributes to fatigue and are associated with obesity, breast, and colon cancer. She agrees to continue to take prescription Vit D @50 ,000 IU every week and will follow up for routine testing of vitamin D, at least 2-3 times per year. She was informed of the risk of over-replacement of vitamin D and agrees to not increase her dose unless she discusses this with [name] first.  Pre-Diabetes Joan [name] will continue to work on weight loss, exercise, increasing lean protein and decreasing simple carbohydrates in her diet to help decrease the risk of  diabetes. She was informed that eating too many simple carbohydrates or too many calories at one sitting increases the likelihood of GI side effects. Joan [name] agreed to follow up with [name] as directed to monitor her progress.  I spent > than 50% of the 15 minute visit on counseling as documented in the note.  Obesity Joan [name] is currently in the action stage of change. As such, her goal is to continue with weight loss efforts She has agreed to follow the Category 3 plan Joan [name] has been instructed to work up to a goal of 150 minutes of combined cardio and strengthening exercise per week for weight loss and overall health benefits. We discussed the following Behavioral Modification Strategies today: increase H2O intake, keeping healthy foods in the home, increasing lean protein intake, decreasing simple carbohydrates, increasing vegetables and work on meal planning and easy cooking plans Joan [name] will do more organized eating over the weekends.  Joan [name] has agreed to follow up with our clinic in 2 weeks. She was informed of the importance of frequent follow up visits to maximize her success with intensive lifestyle modifications for her multiple health conditions.  ALLERGIES: No Known Allergies  MEDICATIONS: Current Outpatient Medications on File Prior to Visit  Medication Sig Dispense Refill  . dicyclomine (BENTYL) 20 MG tablet Take 20 mg by mouth as needed for spasms.    . mesalamine (PENTASA) 500 MG CR capsule Take 500 mg by mouth 2 (two) times daily.     . Vitamin  D, Ergocalciferol, (DRISDOL) 50000 units CAPS capsule Take 50,000 Units by mouth every 7 (seven) days.     No current facility-administered medications on file prior to visit.     PAST MEDICAL HISTORY: Past Medical History:  Diagnosis Date  . Abnormal Pap smear   . Anemia   . Back pain   . Cancer [facility]) 2013   cervical   . Crohn's disease ([place])   . Joint pain   . Lactose intolerance   . Vitamin D deficiency     PAST  SURGICAL HISTORY: Past Surgical History:  Procedure Laterality Date  . ABDOMINAL HYSTERECTOMY  2013  . CERVICAL CONE BIOPSY    . HYSTEROSCOPY    . LEEP  02/22/2012   Procedure: LOOP ELECTROSURGICAL EXCISION PROCEDURE (LEEP);  Surgeon: Thurnell Lose, MD;  Location: [facility];  Service: Gynecology;  Laterality: N/A;  . MYRINGOTOMY    . TYMPANOSTOMY TUBE PLACEMENT  AGE 48  . WISDOM TOOTH EXTRACTION  ORAL SURG OFFICE--  1998    SOCIAL HISTORY: Social History   Tobacco Use  . Smoking status: Never Smoker  . Smokeless tobacco: Never Used  Substance Use Topics  . Alcohol use: No  . Drug use: No    FAMILY HISTORY: Family History  Problem Relation Age of Onset  . Obesity Mother   . Diabetes Father   . High blood pressure Father   . Kidney disease Father     ROS: Review of Systems  Constitutional: Positive for weight loss.  Gastrointestinal: Negative for nausea and vomiting.  Musculoskeletal:       Negative for muscle weakness  Endo/Heme/Allergies:       Negative for polyphagia    PHYSICAL EXAM: Blood pressure 120/76, pulse 79, temperature 98.4 F (36.9 C), temperature source Oral, height 5' 9"  (1.753 m), weight (!) 315 lb (142.9 kg), last menstrual period 08/21/2012, SpO2 100 %. Body mass index is 46.52 kg/m. Physical Exam Vitals signs reviewed.  Constitutional:      Appearance: Normal appearance. She is well-developed. She is obese.  Cardiovascular:     Rate and Rhythm: Normal rate.  Pulmonary:     Effort: Pulmonary effort is normal.  Musculoskeletal: Normal range of motion.  Skin:    General: Skin is warm and dry.  Neurological:     Mental Status: She is alert and oriented to person, place, and time.  Psychiatric:        Mood and Affect: Mood normal.        Behavior: Behavior normal.     RECENT LABS AND TESTS: BMET    Component Value Date/Time   NA 138 12/28/2018 1219   K 4.3 12/28/2018 1219   CL 101 12/28/2018 1219   CO2 25 12/28/2018  1219   GLUCOSE 86 12/28/2018 1219   BUN 7 12/28/2018 1219   CREATININE 0.71 12/28/2018 1219   CALCIUM 9.2 12/28/2018 1219   GFRNONAA 102 12/28/2018 1219   GFRAA 118 12/28/2018 1219   Lab Results  Component Value Date   HGBA1C 5.7 (H) 12/28/2018   Lab Results  Component Value Date   INSULIN 18.9 12/28/2018   CBC    Component Value Date/Time   HGB 11.7 (L) 02/22/2012 1000   Iron/TIBC/Ferritin/ %Sat No results found for: IRON, TIBC, FERRITIN, IRONPCTSAT Lipid Panel  No results found for: CHOL, TRIG, HDL, CHOLHDL, VLDL, LDLCALC, LDLDIRECT Hepatic Function Panel     Component Value Date/Time   PROT 7.3 12/28/2018 1219   ALBUMIN 4.0 12/28/2018  1219   AST 22 12/28/2018 1219   ALT 22 12/28/2018 1219   ALKPHOS 77 12/28/2018 1219   BILITOT 0.3 12/28/2018 1219      Component Value Date/Time   TSH 4.100 12/28/2018 1219   TSH 2.56 08/20/2014 1030     Ref. Range 12/28/2018 12:19  Vitamin D, 25-Hydroxy Latest Ref Range: 30.0 - 100.0 ng/mL 11.0 (L)     OBESITY BEHAVIORAL INTERVENTION VISIT  Today's visit was # 3   Starting weight: 324 lbs Starting date: 12/28/2018 Today's weight : 315 lbs Today's date: 01/30/2019 Total lbs lost to date: 9   ASK: We discussed the diagnosis of obesity with Sheran Spine today and [facility] agreed to give [name] permission to discuss obesity behavioral modification therapy today.  ASSESS: Cadynce has the diagnosis of obesity and her BMI today is 9.5 Alexie is in the action stage of change   ADVISE: Ozetta was educated on the multiple health risks of obesity as well as the benefit of weight loss to improve her health. She was advised of the need for long term treatment and the importance of lifestyle modifications to improve her current health and to decrease her risk of future health problems.  AGREE: Multiple dietary modification options and treatment options were discussed and  Shinita agreed to follow the recommendations documented in the  above note.  ARRANGE: Bhakti was educated on the importance of frequent visits to treat obesity as outlined per CMS and USPSTF guidelines and agreed to schedule her next follow up appointment today.  Corey Skains, am acting as Location manager for [employer]. Owens Shark, DO  I have reviewed the above documentation for accuracy and completeness, and I agree with the above. -Jearld Lesch, DO

## 2019-02-15 ENCOUNTER — Ambulatory Visit (INDEPENDENT_AMBULATORY_CARE_PROVIDER_SITE_OTHER): Payer: BLUE CROSS/BLUE SHIELD | Admitting: Bariatrics

## 2019-02-15 ENCOUNTER — Encounter (INDEPENDENT_AMBULATORY_CARE_PROVIDER_SITE_OTHER): Payer: Self-pay | Admitting: Bariatrics

## 2019-02-15 VITALS — BP 110/73 | HR 74 | Temp 98.0°F | Ht 69.0 in | Wt 314.0 lb

## 2019-02-15 DIAGNOSIS — R7303 Prediabetes: Secondary | ICD-10-CM | POA: Diagnosis not present

## 2019-02-15 DIAGNOSIS — E559 Vitamin D deficiency, unspecified: Secondary | ICD-10-CM | POA: Diagnosis not present

## 2019-02-15 DIAGNOSIS — Z6841 Body Mass Index (BMI) 40.0 and over, adult: Secondary | ICD-10-CM | POA: Diagnosis not present

## 2019-02-16 ENCOUNTER — Encounter (INDEPENDENT_AMBULATORY_CARE_PROVIDER_SITE_OTHER): Payer: Self-pay

## 2019-02-16 NOTE — Progress Notes (Signed)
Office: [phone]  /  Fax: [phone]   HPI:   Chief Complaint: OBESITY Joan [name] is here to discuss her progress with her obesity treatment plan. She is on the Category 3 plan and is following her eating plan approximately 65 to 70 % of the time. She states she is exercising 0 minutes 0 times per week. Joan [name] has been under more stress (death in the family). She has not been eating consistently. Her weight is (!) 314 lb (142.4 kg) today and has had a weight loss of 1 pound over a period of 2 weeks since her last visit. She has lost 10 lbs since starting treatment with [name].  Vitamin D deficiency Joan [name] has a diagnosis of vitamin D deficiency. She is currently taking vit D and denies nausea, vomiting or muscle weakness.  Pre-Diabetes Joan [name] has a diagnosis of prediabetes based on her elevated Hgb A1c and was informed this puts her at greater risk of developing diabetes. Her last A1c was at 5.7 and last insulin level was at 18.9 She is not taking medications currently and continues to work on diet and exercise to decrease risk of diabetes. She denies polyphagia.  ASSESSMENT AND PLAN:  Vitamin D deficiency  Prediabetes  Class 3 severe obesity with serious comorbidity and body mass index (BMI) of 45.0 to 49.9 in adult, unspecified obesity type ([place])  PLAN:  Vitamin D Deficiency Joan [name] was informed that low vitamin D levels contributes to fatigue and are associated with obesity, breast, and colon cancer. She agrees to continue to take prescription Vit D @50 ,000 IU every week and will follow up for routine testing of vitamin D, at least 2-3 times per year. She was informed of the risk of over-replacement of vitamin D and agrees to not increase her dose unless she discusses this with [name] first.  Pre-Diabetes Joan [name] will continue to work on weight loss, exercise, increasing lean protein and decreasing simple carbohydrates in her diet to help decrease the risk of diabetes. We dicussed  metformin including benefits and risks. She was informed that eating too many simple carbohydrates or too many calories at one sitting increases the likelihood of GI side effects. [name] agreed to follow up with [name] as directed to monitor her progress.  I spent > than 50% of the 15 minute visit on counseling as documented in the note.  Obesity Joan [name] is currently in the action stage of change. As such, her goal is to continue with weight loss efforts She has agreed to follow the Category 3 plan Joan [name] has been instructed to work up to a goal of 150 minutes of combined cardio and strengthening exercise per week for weight loss and overall health benefits. We discussed the following Behavioral Modification Strategies today: increase H2O intake, no skipping meals, keeping healthy foods in the home, increasing lean protein intake, decreasing simple carbohydrates, increasing vegetables, decrease eating out and work on meal planning and easy cooking plans We discussed protein options today. Additional lunch and dinner options were given to patient today.  Joan [name] has agreed to follow up with our clinic in 2 weeks. She was informed of the importance of frequent follow up visits to maximize her success with intensive lifestyle modifications for her multiple health conditions.  ALLERGIES: No Known Allergies  MEDICATIONS: Current Outpatient Medications on File Prior to Visit  Medication Sig Dispense Refill  . dicyclomine (BENTYL) 20 MG tablet Take 20 mg by mouth as needed for spasms.    . mesalamine (PENTASA) 500 MG CR  capsule Take 500 mg by mouth 2 (two) times daily.     . Vitamin D, Ergocalciferol, (DRISDOL) 50000 units CAPS capsule Take 50,000 Units by mouth every 7 (seven) days.     No current facility-administered medications on file prior to visit.     PAST MEDICAL HISTORY: Past Medical History:  Diagnosis Date  . Abnormal Pap smear   . Anemia   . Back pain   . Cancer [facility]) 2013    cervical   . Crohn's disease ([place])   . Joint pain   . Lactose intolerance   . Vitamin D deficiency     PAST SURGICAL HISTORY: Past Surgical History:  Procedure Laterality Date  . ABDOMINAL HYSTERECTOMY  2013  . CERVICAL CONE BIOPSY    . HYSTEROSCOPY    . LEEP  02/22/2012   Procedure: LOOP ELECTROSURGICAL EXCISION PROCEDURE (LEEP);  Surgeon: Thurnell Lose, MD;  Location: [facility];  Service: Gynecology;  Laterality: N/A;  . MYRINGOTOMY    . TYMPANOSTOMY TUBE PLACEMENT  AGE 89  . WISDOM TOOTH EXTRACTION  ORAL SURG OFFICE--  1998    SOCIAL HISTORY: Social History   Tobacco Use  . Smoking status: Never Smoker  . Smokeless tobacco: Never Used  Substance Use Topics  . Alcohol use: No  . Drug use: No    FAMILY HISTORY: Family History  Problem Relation Age of Onset  . Obesity Mother   . Diabetes Father   . High blood pressure Father   . Kidney disease Father     ROS: Review of Systems  Constitutional: Positive for weight loss.  Gastrointestinal: Negative for nausea and vomiting.  Musculoskeletal:       Negative for muscle weakness  Endo/Heme/Allergies:       Negative for polyphagia    PHYSICAL EXAM: Blood pressure 110/73, pulse 74, temperature 98 F (36.7 C), temperature source Oral, height 5' 9"  (1.753 m), weight (!) 314 lb (142.4 kg), last menstrual period 08/21/2012, SpO2 98 %. Body mass index is 46.37 kg/m. Physical Exam Vitals signs reviewed.  Constitutional:      Appearance: Normal appearance. She is well-developed. She is obese.  Cardiovascular:     Rate and Rhythm: Normal rate.  Pulmonary:     Effort: Pulmonary effort is normal.  Musculoskeletal: Normal range of motion.  Skin:    General: Skin is warm and dry.  Neurological:     Mental Status: She is alert and oriented to person, place, and time.  Psychiatric:        Mood and Affect: Mood normal.        Behavior: Behavior normal.     RECENT LABS AND TESTS: BMET    Component  Value Date/Time   NA 138 12/28/2018 1219   K 4.3 12/28/2018 1219   CL 101 12/28/2018 1219   CO2 25 12/28/2018 1219   GLUCOSE 86 12/28/2018 1219   BUN 7 12/28/2018 1219   CREATININE 0.71 12/28/2018 1219   CALCIUM 9.2 12/28/2018 1219   GFRNONAA 102 12/28/2018 1219   GFRAA 118 12/28/2018 1219   Lab Results  Component Value Date   HGBA1C 5.7 (H) 12/28/2018   Lab Results  Component Value Date   INSULIN 18.9 12/28/2018   CBC    Component Value Date/Time   HGB 11.7 (L) 02/22/2012 1000   Iron/TIBC/Ferritin/ %Sat No results found for: IRON, TIBC, FERRITIN, IRONPCTSAT Lipid Panel  No results found for: CHOL, TRIG, HDL, CHOLHDL, VLDL, LDLCALC, LDLDIRECT Hepatic Function Panel  Component Value Date/Time   PROT 7.3 12/28/2018 1219   ALBUMIN 4.0 12/28/2018 1219   AST 22 12/28/2018 1219   ALT 22 12/28/2018 1219   ALKPHOS 77 12/28/2018 1219   BILITOT 0.3 12/28/2018 1219      Component Value Date/Time   TSH 4.100 12/28/2018 1219   TSH 2.56 08/20/2014 1030     Ref. Range 12/28/2018 12:19  Vitamin D, 25-Hydroxy Latest Ref Range: 30.0 - 100.0 ng/mL 11.0 (L)     OBESITY BEHAVIORAL INTERVENTION VISIT  Today's visit was # 4   Starting weight: 324 lbs Starting date: 12/28/2018 Today's weight : 314 lbs Today's date: 02/15/2019 Total lbs lost to date: 10    02/15/2019  Height 5' 9"  (1.753 m)  Weight 314 lb (142.4 kg) (A)  BMI (Calculated) 46.35  BLOOD PRESSURE - SYSTOLIC 170  BLOOD PRESSURE - DIASTOLIC 73   Body Fat % 01.7 %  Total Body Water (lbs) 108.4 lbs    ASK: We discussed the diagnosis of obesity with Sheran Spine today and Taneisha agreed to give [name] permission to discuss obesity behavioral modification therapy today.  ASSESS: Janeah has the diagnosis of obesity and her BMI today is 46.35 Anaira is in the action stage of change   ADVISE: Malini was educated on the multiple health risks of obesity as well as the benefit of weight loss to improve her health. She  was advised of the need for long term treatment and the importance of lifestyle modifications to improve her current health and to decrease her risk of future health problems.  AGREE: Multiple dietary modification options and treatment options were discussed and  Cira agreed to follow the recommendations documented in the above note.  ARRANGE: Rucha was educated on the importance of frequent visits to treat obesity as outlined per CMS and USPSTF guidelines and agreed to schedule her next follow up appointment today.  Corey Skains, am acting as Location manager for [employer]. Owens Shark, DO  I have reviewed the above documentation for accuracy and completeness, and I agree with the above. -Jearld Lesch, DO

## 2019-03-01 ENCOUNTER — Ambulatory Visit (INDEPENDENT_AMBULATORY_CARE_PROVIDER_SITE_OTHER): Payer: BLUE CROSS/BLUE SHIELD | Admitting: Family Medicine

## 2019-03-09 ENCOUNTER — Ambulatory Visit (INDEPENDENT_AMBULATORY_CARE_PROVIDER_SITE_OTHER): Payer: BLUE CROSS/BLUE SHIELD | Admitting: Bariatrics

## 2019-03-09 ENCOUNTER — Encounter (INDEPENDENT_AMBULATORY_CARE_PROVIDER_SITE_OTHER): Payer: Self-pay | Admitting: Bariatrics

## 2019-03-09 ENCOUNTER — Other Ambulatory Visit: Payer: Self-pay

## 2019-03-09 VITALS — BP 109/72 | HR 81 | Temp 97.8°F | Ht 69.0 in | Wt 314.0 lb

## 2019-03-09 DIAGNOSIS — R7303 Prediabetes: Secondary | ICD-10-CM

## 2019-03-09 DIAGNOSIS — Z9189 Other specified personal risk factors, not elsewhere classified: Secondary | ICD-10-CM | POA: Diagnosis not present

## 2019-03-09 DIAGNOSIS — E559 Vitamin D deficiency, unspecified: Secondary | ICD-10-CM

## 2019-03-09 DIAGNOSIS — Z6841 Body Mass Index (BMI) 40.0 and over, adult: Secondary | ICD-10-CM

## 2019-03-09 MED ORDER — VITAMIN D (ERGOCALCIFEROL) 1.25 MG (50000 UNIT) PO CAPS: 50000.0000 [IU] | ORAL_CAPSULE | ORAL | 0 refills | Status: DC

## 2019-03-09 NOTE — Progress Notes (Signed)
Office: [phone]  /  Fax: [phone]   HPI:   Chief Complaint: OBESITY Joan [name] is here to discuss her progress with her obesity treatment plan. She is on the Category 3 plan and is following her eating plan approximately 70 to 75 % of the time. She states she is exercising 0 minutes 0 times per week. Joan [name] has skipped some meals due to increased stress. She is up about 1 pound in water weight. Her sleep is off. Her weight is (!) 314 lb (142.4 kg) today and she has maintained weight over a period of 3 weeks since her last visit. She has lost 10 lbs since starting treatment with [name].  Vitamin D deficiency Joan [name] has a diagnosis of vitamin D deficiency. She is currently taking vit D and denies nausea, vomiting or muscle weakness.  At risk for osteopenia and osteoporosis Joan [name] is at higher risk of osteopenia and osteoporosis due to vitamin D deficiency.   Pre-Diabetes Joan [name] has a diagnosis of prediabetes based on her elevated Hgb A1c and was informed this puts her at greater risk of developing diabetes. Her last A1c was at 5.7 and last insulin level was at 18.9 She is not taking metformin currently and continues to work on diet and exercise to decrease risk of diabetes. She denies polyphagia.  ASSESSMENT AND PLAN:  Vitamin D deficiency - Plan: Vitamin D, Ergocalciferol, (DRISDOL) 1.25 MG (50000 UT) CAPS capsule  Prediabetes  At risk for osteoporosis  Class 3 severe obesity with serious comorbidity and body mass index (BMI) of 45.0 to 49.9 in adult, unspecified obesity type ([place])  PLAN:  Vitamin D Deficiency Joan [name] was informed that low vitamin D levels contributes to fatigue and are associated with obesity, breast, and colon cancer. She agrees to continue to take prescription Vit D @50 ,000 IU every week #4 with no refills and will follow up for routine testing of vitamin D, at least 2-3 times per year. She was informed of the risk of over-replacement of vitamin D and agrees to  not increase her dose unless she discusses this with [name] first. Joan [name] agrees to follow up with our clinic in 2 weeks.  At risk for osteopenia and osteoporosis Joan [name] was given extended  (15 minutes) osteoporosis prevention counseling today. Joan [name] is at risk for osteopenia and osteoporosis due to her vitamin D deficiency. She was encouraged to take her vitamin D and follow her higher calcium diet and increase strengthening exercise to help strengthen her bones and decrease her risk of osteopenia and osteoporosis.  Pre-Diabetes Joan [name] will continue to work on weight loss, exercise, increasing lean protein and decreasing simple carbohydrates in her diet to help decrease the risk of diabetes. She was informed that eating too many simple carbohydrates or too many calories at one sitting increases the likelihood of GI side effects. Joan [name] agreed to follow up with [name] as directed to monitor her progress.  Obesity Joan [name] is currently in the action stage of change. As such, her goal is to continue with weight loss efforts She has agreed to follow the Category 3 plan Joan [name] has been instructed to work up to a goal of 150 minutes of combined cardio and strengthening exercise per week for weight loss and overall health benefits. We discussed the following Behavioral Modification Strategies today: increase H2O intake, no skipping meals, keeping healthy foods in the home, increasing lean protein intake, protein content of foods, decreasing simple carbohydrates, increasing vegetables, decrease eating out, work on meal planning and easy cooking plans  and decrease liquid calories Joan [name] will continue to increase her water intake. She will increase her protein intake and will spread out her protein throughout the day.  Joan [name] has agreed to follow up with our clinic in 2 weeks. She was informed of the importance of frequent follow up visits to maximize her success with intensive lifestyle modifications for her  multiple health conditions.  ALLERGIES: No Known Allergies  MEDICATIONS: Current Outpatient Medications on File Prior to Visit  Medication Sig Dispense Refill   dicyclomine (BENTYL) 20 MG tablet Take 20 mg by mouth as needed for spasms.     mesalamine (PENTASA) 500 MG CR capsule Take 500 mg by mouth 2 (two) times daily.      No current facility-administered medications on file prior to visit.     PAST MEDICAL HISTORY: Past Medical History:  Diagnosis Date   Abnormal Pap smear    Anemia    Back pain    Cancer ([place]) 2013   cervical    Crohn's disease ([place])    Joint pain    Lactose intolerance    Vitamin D deficiency     PAST SURGICAL HISTORY: Past Surgical History:  Procedure Laterality Date   ABDOMINAL HYSTERECTOMY  2013   CERVICAL CONE BIOPSY     HYSTEROSCOPY     LEEP  02/22/2012   Procedure: LOOP ELECTROSURGICAL EXCISION PROCEDURE (LEEP);  Surgeon: Thurnell Lose, MD;  Location: [facility];  Service: Gynecology;  Laterality: N/A;   MYRINGOTOMY     TYMPANOSTOMY TUBE PLACEMENT  AGE 20   WISDOM TOOTH EXTRACTION  ORAL SURG OFFICE--  1998    SOCIAL HISTORY: Social History   Tobacco Use   Smoking status: Never Smoker   Smokeless tobacco: Never Used  Substance Use Topics   Alcohol use: No   Drug use: No    FAMILY HISTORY: Family History  Problem Relation Age of Onset   Obesity Mother    Diabetes Father    High blood pressure Father    Kidney disease Father     ROS: Review of Systems  Constitutional: Negative for weight loss.  Gastrointestinal: Negative for nausea and vomiting.  Musculoskeletal:       Negative for muscle weakness  Endo/Heme/Allergies:       Negative for polyphagia    PHYSICAL EXAM: Blood pressure 109/72, pulse 81, temperature 97.8 F (36.6 C), temperature source Oral, height 5' 9"  (1.753 m), weight (!) 314 lb (142.4 kg), last menstrual period 08/21/2012, SpO2 98 %. Body mass index is 46.37  kg/m. Physical Exam Vitals signs reviewed.  Constitutional:      Appearance: Normal appearance. She is well-developed. She is obese.  Cardiovascular:     Rate and Rhythm: Normal rate.  Pulmonary:     Effort: Pulmonary effort is normal.  Musculoskeletal: Normal range of motion.  Skin:    General: Skin is warm and dry.  Neurological:     Mental Status: She is alert and oriented to person, place, and time.  Psychiatric:        Mood and Affect: Mood normal.        Behavior: Behavior normal.     RECENT LABS AND TESTS: BMET    Component Value Date/Time   NA 138 12/28/2018 1219   K 4.3 12/28/2018 1219   CL 101 12/28/2018 1219   CO2 25 12/28/2018 1219   GLUCOSE 86 12/28/2018 1219   BUN 7 12/28/2018 1219   CREATININE 0.71 12/28/2018 1219   CALCIUM 9.2  12/28/2018 1219   GFRNONAA 102 12/28/2018 1219   GFRAA 118 12/28/2018 1219   Lab Results  Component Value Date   HGBA1C 5.7 (H) 12/28/2018   Lab Results  Component Value Date   INSULIN 18.9 12/28/2018   CBC    Component Value Date/Time   HGB 11.7 (L) 02/22/2012 1000   Iron/TIBC/Ferritin/ %Sat No results found for: IRON, TIBC, FERRITIN, IRONPCTSAT Lipid Panel  No results found for: CHOL, TRIG, HDL, CHOLHDL, VLDL, LDLCALC, LDLDIRECT Hepatic Function Panel     Component Value Date/Time   PROT 7.3 12/28/2018 1219   ALBUMIN 4.0 12/28/2018 1219   AST 22 12/28/2018 1219   ALT 22 12/28/2018 1219   ALKPHOS 77 12/28/2018 1219   BILITOT 0.3 12/28/2018 1219      Component Value Date/Time   TSH 4.100 12/28/2018 1219   TSH 2.56 08/20/2014 1030   Results for [name], Joan R (MRN [id number]) as of 03/09/2019 17:50  Ref. Range 12/28/2018 12:19  Vitamin D, 25-Hydroxy Latest Ref Range: 30.0 - 100.0 ng/mL 11.0 (L)     OBESITY BEHAVIORAL INTERVENTION VISIT  Today's visit was # 5   Starting weight: 324 lbs Starting date: 12/28/2018 Today's weight : 314 lbs  Today's date: 03/09/2019 Total lbs lost to date: 10    03/09/2019   Height 5' 9"  (1.753 m)  Weight 314 lb (142.4 kg) (A)  BMI (Calculated) 46.35  BLOOD PRESSURE - SYSTOLIC 375  BLOOD PRESSURE - DIASTOLIC 72   Body Fat % 43.6 %  Total Body Water (lbs) 109.2 lbs    ASK: We discussed the diagnosis of obesity with Joan [name] today and Joan [name] agreed to give [name] permission to discuss obesity behavioral modification therapy today.  ASSESS: Georgi has the diagnosis of obesity and her BMI today is 46.35 Abbi is in the action stage of change   ADVISE: Purvi was educated on the multiple health risks of obesity as well as the benefit of weight loss to improve her health. She was advised of the need for long term treatment and the importance of lifestyle modifications to improve her current health and to decrease her risk of future health problems.  AGREE: Multiple dietary modification options and treatment options were discussed and  Alyah agreed to follow the recommendations documented in the above note.  ARRANGE: Adamariz was educated on the importance of frequent visits to treat obesity as outlined per CMS and USPSTF guidelines and agreed to schedule her next follow up appointment today.  Corey Skains, am acting as Location manager for [employer]. Owens Shark, DO  I have reviewed the above documentation for accuracy and completeness, and I agree with the above. -Jearld Lesch, DO

## 2019-03-14 ENCOUNTER — Encounter (INDEPENDENT_AMBULATORY_CARE_PROVIDER_SITE_OTHER): Payer: Self-pay

## 2019-03-28 ENCOUNTER — Ambulatory Visit (INDEPENDENT_AMBULATORY_CARE_PROVIDER_SITE_OTHER): Payer: BLUE CROSS/BLUE SHIELD | Admitting: Bariatrics

## 2019-03-28 ENCOUNTER — Encounter (INDEPENDENT_AMBULATORY_CARE_PROVIDER_SITE_OTHER): Payer: Self-pay | Admitting: Bariatrics

## 2019-03-28 ENCOUNTER — Other Ambulatory Visit: Payer: Self-pay

## 2019-03-28 DIAGNOSIS — E559 Vitamin D deficiency, unspecified: Secondary | ICD-10-CM

## 2019-03-28 DIAGNOSIS — Z6841 Body Mass Index (BMI) 40.0 and over, adult: Secondary | ICD-10-CM | POA: Diagnosis not present

## 2019-03-28 DIAGNOSIS — R7303 Prediabetes: Secondary | ICD-10-CM

## 2019-03-29 ENCOUNTER — Other Ambulatory Visit (INDEPENDENT_AMBULATORY_CARE_PROVIDER_SITE_OTHER): Payer: Self-pay | Admitting: Bariatrics

## 2019-03-29 DIAGNOSIS — E559 Vitamin D deficiency, unspecified: Secondary | ICD-10-CM

## 2019-03-29 NOTE — Progress Notes (Signed)
Office: [phone]  /  Fax: [phone] TeleHealth Visit:  Joan [name] has verbally consented to this TeleHealth visit today. The patient is located at home, the provider is located at the [employer] and [facility]ness office. The participants in this visit include the listed provider and patient and any and all parties involved. The visit was conducted today via Webex.  HPI:   Chief Complaint: OBESITY Joan [name] is here to discuss her progress with her obesity treatment plan. She is on the Category 3 plan and is following her eating plan approximately 50 to 60 % of the time. She states she is doing cardio exercise for 40 minutes 2 times per week. Joan [name] thinks that she has gained weight. She is working form home. Joan [name] has stressed sometimes, due to social distancing. It has been difficult to get the foods that she needs. Joan [name] has done minimal stress eating. We were unable to weigh the patient today for this TeleHealth visit. She feels as if she has gained weight since her last visit. She has lost 10 lbs since starting treatment with [name].  Vitamin D deficiency Joan [name] has a diagnosis of vitamin D deficiency. She is taking high dose vit D and denies nausea, vomiting or muscle weakness.  Pre-Diabetes Joan [name] has a diagnosis of prediabetes based on her elevated Hgb A1c and was informed this puts her at greater risk of developing diabetes. She is not taking medications currently and continues to work on diet and exercise to decrease risk of diabetes. Her last A1c was at 5.7 and last insulin level was at 18.9 She denies nausea or hypoglycemia.  ASSESSMENT AND PLAN:  Vitamin D deficiency  Prediabetes  Class 3 severe obesity with serious comorbidity and body mass index (BMI) of 45.0 to 49.9 in adult, unspecified obesity type ([place])  PLAN:  Vitamin D Deficiency Joan [name] was informed that low vitamin D levels contributes to fatigue and are associated with obesity, breast, and colon cancer. She  agrees to continue to take prescription Vit D @50 ,000 IU every week and will follow up for routine testing of vitamin D, at least 2-3 times per year. She was informed of the risk of over-replacement of vitamin D and agrees to not increase her dose unless she discusses this with [name] first.  Pre-Diabetes Joan [name] will continue to work on weight loss, exercise, increasing lean protein and decreasing simple carbohydrates in her diet to help decrease the risk of diabetes. She was informed that eating too many simple carbohydrates or too many calories at one sitting increases the likelihood of GI side effects. Joan [name] agreed to follow up with [name] as directed to monitor her progress.  Obesity Joan [name] is currently in the action stage of change. As such, her goal is to continue with weight loss efforts She has agreed to follow the Category 3 plan [name] has been instructed to work up to a goal of 150 minutes of combined cardio and strengthening exercise per week for weight loss and overall health benefits. We discussed the following Behavioral Modification Strategies today: increase H2O intake, no skipping meals, keeping healthy foods in the home, increasing lean protein intake, decreasing simple carbohydrates, increasing vegetables, decrease eating out and work on meal planning and easy cooking plans Joan [name] will weigh herself at home before each Tele-Health visit. Joan [name] will substitute certain proteins for meat.  Joan [name] has agreed to follow up with our clinic in 2 weeks. She was informed of the importance of frequent follow up visits to maximize her success  with intensive lifestyle modifications for her multiple health conditions.  ALLERGIES: No Known Allergies  MEDICATIONS: Current Outpatient Medications on File Prior to Visit  Medication Sig Dispense Refill  . dicyclomine (BENTYL) 20 MG tablet Take 20 mg by mouth as needed for spasms.    . mesalamine (PENTASA) 500 MG CR capsule Take 500 mg by mouth 2  (two) times daily.     . Vitamin D, Ergocalciferol, (DRISDOL) 1.25 MG (50000 UT) CAPS capsule Take 1 capsule (50,000 Units total) by mouth every 7 (seven) days. 4 capsule 0   No current facility-administered medications on file prior to visit.     PAST MEDICAL HISTORY: Past Medical History:  Diagnosis Date  . Abnormal Pap smear   . Anemia   . Back pain   . Cancer [facility]) 2013   cervical   . Crohn's disease ([place])   . Joint pain   . Lactose intolerance   . Vitamin D deficiency     PAST SURGICAL HISTORY: Past Surgical History:  Procedure Laterality Date  . ABDOMINAL HYSTERECTOMY  2013  . CERVICAL CONE BIOPSY    . HYSTEROSCOPY    . LEEP  02/22/2012   Procedure: LOOP ELECTROSURGICAL EXCISION PROCEDURE (LEEP);  Surgeon: Thurnell Lose, MD;  Location: [facility];  Service: Gynecology;  Laterality: N/A;  . MYRINGOTOMY    . TYMPANOSTOMY TUBE PLACEMENT  AGE 48  . WISDOM TOOTH EXTRACTION  ORAL SURG OFFICE--  1998    SOCIAL HISTORY: Social History   Tobacco Use  . Smoking status: Never Smoker  . Smokeless tobacco: Never Used  Substance Use Topics  . Alcohol use: No  . Drug use: No    FAMILY HISTORY: Family History  Problem Relation Age of Onset  . Obesity Mother   . Diabetes Father   . High blood pressure Father   . Kidney disease Father     ROS: Review of Systems  Constitutional: Negative for weight loss.  Gastrointestinal: Negative for nausea and vomiting.  Musculoskeletal:       Negative for muscle weakness  Endo/Heme/Allergies:       Negative for hypoglycemia    PHYSICAL EXAM: Pt in no acute distress  RECENT LABS AND TESTS: BMET    Component Value Date/Time   NA 138 12/28/2018 1219   K 4.3 12/28/2018 1219   CL 101 12/28/2018 1219   CO2 25 12/28/2018 1219   GLUCOSE 86 12/28/2018 1219   BUN 7 12/28/2018 1219   CREATININE 0.71 12/28/2018 1219   CALCIUM 9.2 12/28/2018 1219   GFRNONAA 102 12/28/2018 1219   GFRAA 118 12/28/2018 1219   Lab  Results  Component Value Date   HGBA1C 5.7 (H) 12/28/2018   Lab Results  Component Value Date   INSULIN 18.9 12/28/2018   CBC    Component Value Date/Time   HGB 11.7 (L) 02/22/2012 1000   Iron/TIBC/Ferritin/ %Sat No results found for: IRON, TIBC, FERRITIN, IRONPCTSAT Lipid Panel  No results found for: CHOL, TRIG, HDL, CHOLHDL, VLDL, LDLCALC, LDLDIRECT Hepatic Function Panel     Component Value Date/Time   PROT 7.3 12/28/2018 1219   ALBUMIN 4.0 12/28/2018 1219   AST 22 12/28/2018 1219   ALT 22 12/28/2018 1219   ALKPHOS 77 12/28/2018 1219   BILITOT 0.3 12/28/2018 1219      Component Value Date/Time   TSH 4.100 12/28/2018 1219   TSH 2.56 08/20/2014 1030     Ref. Range 12/28/2018 12:19  Vitamin D, 25-Hydroxy Latest Ref Range: 30.0 -  100.0 ng/mL 11.0 (L)    I, Doreene Nest, am acting as Location manager for [employer]. Owens Shark, DO  I have reviewed the above documentation for accuracy and completeness, and I agree with the above. -Jearld Lesch, DO

## 2019-04-11 ENCOUNTER — Other Ambulatory Visit: Payer: Self-pay

## 2019-04-11 ENCOUNTER — Encounter (INDEPENDENT_AMBULATORY_CARE_PROVIDER_SITE_OTHER): Payer: Self-pay | Admitting: Bariatrics

## 2019-04-11 ENCOUNTER — Ambulatory Visit (INDEPENDENT_AMBULATORY_CARE_PROVIDER_SITE_OTHER): Payer: BLUE CROSS/BLUE SHIELD | Admitting: Bariatrics

## 2019-04-11 DIAGNOSIS — E559 Vitamin D deficiency, unspecified: Secondary | ICD-10-CM

## 2019-04-11 DIAGNOSIS — R7303 Prediabetes: Secondary | ICD-10-CM

## 2019-04-11 DIAGNOSIS — Z6841 Body Mass Index (BMI) 40.0 and over, adult: Secondary | ICD-10-CM | POA: Diagnosis not present

## 2019-04-11 MED ORDER — VITAMIN D (ERGOCALCIFEROL) 1.25 MG (50000 UNIT) PO CAPS: 50000.0000 [IU] | ORAL_CAPSULE | ORAL | 0 refills | Status: DC

## 2019-04-11 NOTE — Progress Notes (Signed)
Office: [phone]  /  Fax: [phone] TeleHealth Visit:  Joan [name] has verbally consented to this TeleHealth visit today. The patient is located at home, the provider is located at the [employer] [facility] office. The participants in this visit include the listed provider and patient. Joan [name] was unable to use realtime audiovisual technology today and the telehealth visit was conducted via telephone.  HPI:   Chief Complaint: OBESITY Joan [name] is here to discuss her progress with her obesity treatment plan. She is on the Category 3 plan and is following her eating plan approximately 75 % of the time. She states she is exercising 0 minutes 0 times per week. Joan [name] is unsure whether she has lost weight or not. She is doing well and she is not stress eating for the most part.  We were unable to weigh the patient today for this TeleHealth visit. She feels as if she has lost weight since her last visit. She has lost 10 lbs since starting treatment with [name].  Vitamin D Deficiency Joan [name] has a diagnosis of vitamin D deficiency. She is currently on vit D. Joan [name] denies nausea, vomiting, or muscle weakness.  Pre-Diabetes Joan [name] has a diagnosis of pre-diabetes based on her elevated Hgb A1c of 5.7 and fasting Insulin of 18.9 on 12/28/18. She was informed this puts her at greater risk of developing diabetes. She is not taking medications currently and continues to work on diet and exercise to decrease risk of diabetes.    ASSESSMENT AND PLAN:  Vitamin D deficiency - Plan: Vitamin D, Ergocalciferol, (DRISDOL) 1.25 MG (50000 UT) CAPS capsule  Prediabetes  Class 3 severe obesity with serious comorbidity and body mass index (BMI) of 45.0 to 49.9 in adult, unspecified obesity type ([place])  PLAN:  Vitamin D Deficiency Joan [name] was informed that low vitamin D levels contribute to fatigue and are associated with obesity, breast, and colon cancer. Joan [name] agrees to continue to take prescription Vit D  @50 ,000 IU every week #4 with no refills and will follow up for routine testing of vitamin D, at least 2-3 times per year. She was informed of the risk of over-replacement of vitamin D and agrees to not increase her dose unless she discusses this with [name] first. Joan [name] agrees to follow up in 2 weeks as directed.  Pre-Diabetes Joan [name] will continue to work on weight loss, exercise, and decreasing simple carbohydrates in her diet to help decrease the risk of diabetes. She was informed that eating too many simple carbohydrates or too many calories at one sitting increases the likelihood of GI side effects. Joan [name] agreed to decrease carbohydrates, increase protein, and increase exercise and activity overall. Joan [name] agrees to follow up with [name] as directed to monitor her progress in 2 weeks.   Obesity Joan [name] is currently in the action stage of change. As such, her goal is to continue with weight loss efforts. She has agreed to follow the Category 3 plan with meal planning and increasing protein. She will weigh herself at home if possible. Joan [name] has been instructed to resume exercise. We discussed the following Behavioral Modification Strategies today: increasing lean protein intake, decreasing simple carbohydrates, increasing vegetables, increase H2O intake, decrease eating out, no skipping meals, keeping healthy foods in the home, and work on meal planning and easy cooking plans.  Joan [name] has agreed to follow up with our clinic in 2 weeks. She was informed of the importance of frequent follow up visits to maximize her success with intensive lifestyle modifications for  her multiple health conditions.  ALLERGIES: No Known Allergies  MEDICATIONS: Current Outpatient Medications on File Prior to Visit  Medication Sig Dispense Refill  . dicyclomine (BENTYL) 20 MG tablet Take 20 mg by mouth as needed for spasms.    . mesalamine (PENTASA) 500 MG CR capsule Take 500 mg by mouth 2 (two) times daily.      No  current facility-administered medications on file prior to visit.     PAST MEDICAL HISTORY: Past Medical History:  Diagnosis Date  . Abnormal Pap smear   . Anemia   . Back pain   . Cancer [facility]) 2013   cervical   . Crohn's disease ([place])   . Joint pain   . Lactose intolerance   . Vitamin D deficiency     PAST SURGICAL HISTORY: Past Surgical History:  Procedure Laterality Date  . ABDOMINAL HYSTERECTOMY  2013  . CERVICAL CONE BIOPSY    . HYSTEROSCOPY    . LEEP  02/22/2012   Procedure: LOOP ELECTROSURGICAL EXCISION PROCEDURE (LEEP);  Surgeon: Thurnell Lose, MD;  Location: [facility];  Service: Gynecology;  Laterality: N/A;  . MYRINGOTOMY    . TYMPANOSTOMY TUBE PLACEMENT  AGE 46  . WISDOM TOOTH EXTRACTION  ORAL SURG OFFICE--  1998    SOCIAL HISTORY: Social History   Tobacco Use  . Smoking status: Never Smoker  . Smokeless tobacco: Never Used  Substance Use Topics  . Alcohol use: No  . Drug use: No    FAMILY HISTORY: Family History  Problem Relation Age of Onset  . Obesity Mother   . Diabetes Father   . High blood pressure Father   . Kidney disease Father     ROS: Review of Systems  Gastrointestinal: Negative for nausea and vomiting.  Musculoskeletal:       Negative for muscle weakness.    PHYSICAL EXAM: Pt in no acute distress  RECENT LABS AND TESTS: BMET    Component Value Date/Time   NA 138 12/28/2018 1219   K 4.3 12/28/2018 1219   CL 101 12/28/2018 1219   CO2 25 12/28/2018 1219   GLUCOSE 86 12/28/2018 1219   BUN 7 12/28/2018 1219   CREATININE 0.71 12/28/2018 1219   CALCIUM 9.2 12/28/2018 1219   GFRNONAA 102 12/28/2018 1219   GFRAA 118 12/28/2018 1219   Lab Results  Component Value Date   HGBA1C 5.7 (H) 12/28/2018   Lab Results  Component Value Date   INSULIN 18.9 12/28/2018   CBC    Component Value Date/Time   HGB 11.7 (L) 02/22/2012 1000   Iron/TIBC/Ferritin/ %Sat No results found for: IRON, TIBC, FERRITIN,  IRONPCTSAT Lipid Panel  No results found for: CHOL, TRIG, HDL, CHOLHDL, VLDL, LDLCALC, LDLDIRECT Hepatic Function Panel     Component Value Date/Time   PROT 7.3 12/28/2018 1219   ALBUMIN 4.0 12/28/2018 1219   AST 22 12/28/2018 1219   ALT 22 12/28/2018 1219   ALKPHOS 77 12/28/2018 1219   BILITOT 0.3 12/28/2018 1219      Component Value Date/Time   TSH 4.100 12/28/2018 1219   TSH 2.56 08/20/2014 1030   Results for Konczal, Gesenia R (MRN [id number]) as of 04/11/2019 13:24  Ref. Range 12/28/2018 12:19  Vitamin D, 25-Hydroxy Latest Ref Range: 30.0 - 100.0 ng/mL 11.0 (L)     I, Marcille Blanco, CMA, am acting as Location manager for [employer]. Owens Shark, DO  I have reviewed the above documentation for accuracy and completeness, and I agree with the above. -  Jearld Lesch, DO

## 2019-04-25 ENCOUNTER — Encounter (INDEPENDENT_AMBULATORY_CARE_PROVIDER_SITE_OTHER): Payer: Self-pay | Admitting: Bariatrics

## 2019-04-25 ENCOUNTER — Ambulatory Visit (INDEPENDENT_AMBULATORY_CARE_PROVIDER_SITE_OTHER): Payer: BLUE CROSS/BLUE SHIELD | Admitting: Bariatrics

## 2019-04-25 ENCOUNTER — Other Ambulatory Visit: Payer: Self-pay

## 2019-04-25 DIAGNOSIS — R7303 Prediabetes: Secondary | ICD-10-CM | POA: Diagnosis not present

## 2019-04-25 DIAGNOSIS — Z6841 Body Mass Index (BMI) 40.0 and over, adult: Secondary | ICD-10-CM

## 2019-04-25 DIAGNOSIS — E559 Vitamin D deficiency, unspecified: Secondary | ICD-10-CM | POA: Diagnosis not present

## 2019-04-25 MED ORDER — VITAMIN D (ERGOCALCIFEROL) 1.25 MG (50000 UNIT) PO CAPS: 50000.0000 [IU] | ORAL_CAPSULE | ORAL | 0 refills | Status: DC

## 2019-04-26 NOTE — Progress Notes (Signed)
Office: [phone]  /  Fax: [phone] TeleHealth Visit:  Joan [name] has verbally consented to this TeleHealth visit today. The patient is located at home, the provider is located at the [employer] [facility] office. The participants in this visit include the listed provider and patient and any and all parties involved. The visit was conducted today via WebEx.  HPI:   Chief Complaint: OBESITY Joan [name] is here to discuss her progress with her obesity treatment plan. She is on the Category 3 plan and is following her eating plan approximately 75 % of the time. She states she is walking 20 to 30 minutes 1 to 2 times per week. Joan [name] is unsure if she has lost weight or not. She has skipped some meals, due to migraines. Joan [name] is staying well hydrated. We were unable to weigh the patient today for this TeleHealth visit. She feels unsure if she has lost weight since her last visit. She has lost 10 lbs since starting treatment with [name].  Vitamin D deficiency Joan [name] has a diagnosis of vitamin D deficiency. Her last vitamin D level was at 11.  She is currently taking vit D and denies nausea, vomiting or muscle weakness.  Pre-Diabetes Joan [name] has a diagnosis of prediabetes based on her elevated Hgb A1c and was informed this puts her at greater risk of developing diabetes. Her last A1c was at 5.7 and last insulin level was at 18.9 She is not taking medications currently and continues to work on diet and exercise to decrease risk of diabetes. She denies nausea or hypoglycemia.  ASSESSMENT AND PLAN:  Vitamin D deficiency - Plan: Vitamin D, Ergocalciferol, (DRISDOL) 1.25 MG (50000 UT) CAPS capsule  Prediabetes  Class 3 severe obesity with serious comorbidity and body mass index (BMI) of 45.0 to 49.9 in adult, unspecified obesity type ([place])  PLAN:  Vitamin D Deficiency Joan [name] was informed that low vitamin D levels contributes to fatigue and are associated with obesity, breast, and colon  cancer. She agrees to continue to take prescription Vit D @50 ,000 IU every week #4 with no refills  and will follow up for routine testing of vitamin D, at least 2-3 times per year. She was informed of the risk of over-replacement of vitamin D and agrees to not increase her dose unless she discusses this with [name] first. Joan [name] agreed to follow up with our clinic in 2 weeks.  Pre-Diabetes Joan [name] will continue to work on weight loss, exercise, increasing lean protein and decreasing simple carbohydrates in her diet to help decrease the risk of diabetes. She was informed that eating too many simple carbohydrates or too many calories at one sitting increases the likelihood of GI side effects. Joan [name] agreed to follow up with [name] as directed to monitor her progress.  Obesity Joan [name] is currently in the action stage of change. As such, her goal is to continue with weight loss efforts She has agreed to follow the Category 3 plan Joan [name] has been instructed to work up to a goal of 150 minutes of combined cardio and strengthening exercise per week for weight loss and overall health benefits. We discussed the following Behavioral Modification Strategies today: planning for success, increase H2O intake, no skipping meals, keeping healthy foods in the home, increasing lean protein intake, decreasing simple carbohydrates, increasing vegetables, decrease eating out and work on meal planning and easy cooking plans Joan [name] will weigh herself at home and record before each visit. Suggestion for [employer] scale was given to patient today.  Joan [name] has agreed to follow up with our clinic in 2 weeks. She was informed of the importance of frequent follow up visits to maximize her success with intensive lifestyle modifications for her multiple health conditions.  ALLERGIES: No Known Allergies  MEDICATIONS: Current Outpatient Medications on File Prior to Visit  Medication Sig Dispense Refill  . dicyclomine (BENTYL) 20 MG tablet  Take 20 mg by mouth as needed for spasms.    . mesalamine (PENTASA) 500 MG CR capsule Take 500 mg by mouth 2 (two) times daily.      No current facility-administered medications on file prior to visit.     PAST MEDICAL HISTORY: Past Medical History:  Diagnosis Date  . Abnormal Pap smear   . Anemia   . Back pain   . Cancer [facility]) 2013   cervical   . Crohn's disease ([place])   . Joint pain   . Lactose intolerance   . Vitamin D deficiency     PAST SURGICAL HISTORY: Past Surgical History:  Procedure Laterality Date  . ABDOMINAL HYSTERECTOMY  2013  . CERVICAL CONE BIOPSY    . HYSTEROSCOPY    . LEEP  02/22/2012   Procedure: LOOP ELECTROSURGICAL EXCISION PROCEDURE (LEEP);  Surgeon: Thurnell Lose, MD;  Location: [facility];  Service: Gynecology;  Laterality: N/A;  . MYRINGOTOMY    . TYMPANOSTOMY TUBE PLACEMENT  AGE 18  . WISDOM TOOTH EXTRACTION  ORAL SURG OFFICE--  1998    SOCIAL HISTORY: Social History   Tobacco Use  . Smoking status: Never Smoker  . Smokeless tobacco: Never Used  Substance Use Topics  . Alcohol use: No  . Drug use: No    FAMILY HISTORY: Family History  Problem Relation Age of Onset  . Obesity Mother   . Diabetes Father   . High blood pressure Father   . Kidney disease Father     ROS: Review of Systems  Gastrointestinal: Negative for nausea and vomiting.  Musculoskeletal:       Negative for muscle weakness  Endo/Heme/Allergies:       Negative for hypoglycemia    PHYSICAL EXAM: Pt in no acute distress  RECENT LABS AND TESTS: BMET    Component Value Date/Time   NA 138 12/28/2018 1219   K 4.3 12/28/2018 1219   CL 101 12/28/2018 1219   CO2 25 12/28/2018 1219   GLUCOSE 86 12/28/2018 1219   BUN 7 12/28/2018 1219   CREATININE 0.71 12/28/2018 1219   CALCIUM 9.2 12/28/2018 1219   GFRNONAA 102 12/28/2018 1219   GFRAA 118 12/28/2018 1219   Lab Results  Component Value Date   HGBA1C 5.7 (H) 12/28/2018   Lab Results   Component Value Date   INSULIN 18.9 12/28/2018   CBC    Component Value Date/Time   HGB 11.7 (L) 02/22/2012 1000   Iron/TIBC/Ferritin/ %Sat No results found for: IRON, TIBC, FERRITIN, IRONPCTSAT Lipid Panel  No results found for: CHOL, TRIG, HDL, CHOLHDL, VLDL, LDLCALC, LDLDIRECT Hepatic Function Panel     Component Value Date/Time   PROT 7.3 12/28/2018 1219   ALBUMIN 4.0 12/28/2018 1219   AST 22 12/28/2018 1219   ALT 22 12/28/2018 1219   ALKPHOS 77 12/28/2018 1219   BILITOT 0.3 12/28/2018 1219      Component Value Date/Time   TSH 4.100 12/28/2018 1219   TSH 2.56 08/20/2014 1030     Ref. Range 12/28/2018 12:19  Vitamin D, 25-Hydroxy Latest Ref Range: 30.0 - 100.0 ng/mL 11.0 (L)  Corey Skains, am acting as Location manager for [employer]. Owens Shark, DO  I have reviewed the above documentation for accuracy and completeness, and I agree with the above. -Jearld Lesch, DO

## 2019-05-09 ENCOUNTER — Ambulatory Visit (INDEPENDENT_AMBULATORY_CARE_PROVIDER_SITE_OTHER): Payer: Self-pay | Admitting: Bariatrics

## 2019-05-11 ENCOUNTER — Encounter (INDEPENDENT_AMBULATORY_CARE_PROVIDER_SITE_OTHER): Payer: Self-pay | Admitting: Bariatrics

## 2019-05-11 ENCOUNTER — Other Ambulatory Visit: Payer: Self-pay

## 2019-05-11 ENCOUNTER — Ambulatory Visit (INDEPENDENT_AMBULATORY_CARE_PROVIDER_SITE_OTHER): Payer: BLUE CROSS/BLUE SHIELD | Admitting: Bariatrics

## 2019-05-11 DIAGNOSIS — R7303 Prediabetes: Secondary | ICD-10-CM | POA: Diagnosis not present

## 2019-05-11 DIAGNOSIS — E559 Vitamin D deficiency, unspecified: Secondary | ICD-10-CM

## 2019-05-11 DIAGNOSIS — Z6841 Body Mass Index (BMI) 40.0 and over, adult: Secondary | ICD-10-CM

## 2019-05-11 NOTE — Progress Notes (Signed)
Office: [phone]  /  Fax: [phone] TeleHealth Visit:  KHORI UNDERBERG has verbally consented to this TeleHealth visit today. The patient is located at a family members home in [place], the provider is located at the [employer] [facility] office. The participants in this visit include the listed provider and patient and any and all parties involved. The visit was conducted today via WebEx.  HPI:   Chief Complaint: OBESITY Joan [name] is here to discuss her progress with her obesity treatment plan. She is on the Category 3 plan and is following her eating plan approximately 75 to 80 % of the time. She states she did 2 work out classes for 30 minutes each last week. Jaimy states that she was losing weight and was down to 310 pounds over last weekend. She states that suddenly had to leave town to care for a family member. We were unable to weigh the patient today for this TeleHealth visit. She feels as if she has lost weight since her last visit. She has lost 14 lbs since starting treatment with [name].  Vitamin D deficiency Korynn has a diagnosis of vitamin D deficiency. She is currently taking vit D and denies nausea, vomiting or muscle weakness.  Pre-Diabetes Camreigh has a diagnosis of prediabetes based on her elevated Hgb A1c and was informed this puts her at greater risk of developing diabetes. Her last A1c was at 5.7 and last insulin level was at 18.9 She is not taking medications currently and continues to work on diet and exercise to decrease risk of diabetes. She denies nausea or hypoglycemia.  ASSESSMENT AND PLAN:  Vitamin D deficiency  Prediabetes  Class 3 severe obesity with serious comorbidity and body mass index (BMI) of 40.0 to 44.9 in adult, unspecified obesity type ([place])  PLAN:  Vitamin D Deficiency Eiliyah was informed that low vitamin D levels contributes to fatigue and are associated with obesity, breast, and colon cancer. She agrees to continue to take  prescription Vit D @50 ,000 IU every week and will follow up for routine testing of vitamin D, at least 2-3 times per year. She was informed of the risk of over-replacement of vitamin D and agrees to not increase her dose unless she discusses this with [name] first.  Pre-Diabetes Nargis will continue to work on weight loss, exercise, increasing lean protein and decreasing simple carbohydrates in her diet to help decrease the risk of diabetes. She was informed that eating too many simple carbohydrates or too many calories at one sitting increases the likelihood of GI side effects. Mallary agreed to follow up with [name] as directed to monitor her progress.  Obesity Talli is currently in the action stage of change. As such, her goal is to continue with weight loss efforts She has agreed to follow the Category 3 plan Tosha will continue exercise for weight loss and overall health benefits. We discussed the following Behavioral Modification Strategies today: increasing lean protein intake, decreasing simple carbohydrates, increasing vegetables, decrease eating out and work on meal planning and intentional eating  Makenze has agreed to follow up with our clinic in 2 weeks. She was informed of the importance of frequent follow up visits to maximize her success with intensive lifestyle modifications for her multiple health conditions.  ALLERGIES: No Known Allergies  MEDICATIONS: Current Outpatient Medications on File Prior to Visit  Medication Sig Dispense Refill  . dicyclomine (BENTYL) 20 MG tablet Take 20 mg by mouth as needed for spasms.    . mesalamine (  PENTASA) 500 MG CR capsule Take 500 mg by mouth 2 (two) times daily.     . Vitamin D, Ergocalciferol, (DRISDOL) 1.25 MG (50000 UT) CAPS capsule Take 1 capsule (50,000 Units total) by mouth every 7 (seven) days. 4 capsule 0   No current facility-administered medications on file prior to visit.     PAST MEDICAL HISTORY: Past Medical History:  Diagnosis  Date  . Abnormal Pap smear   . Anemia   . Back pain   . Cancer [facility]) 2013   cervical   . Crohn's disease ([place])   . Joint pain   . Lactose intolerance   . Vitamin D deficiency     PAST SURGICAL HISTORY: Past Surgical History:  Procedure Laterality Date  . ABDOMINAL HYSTERECTOMY  2013  . CERVICAL CONE BIOPSY    . HYSTEROSCOPY    . LEEP  02/22/2012   Procedure: LOOP ELECTROSURGICAL EXCISION PROCEDURE (LEEP);  Surgeon: Thurnell Lose, MD;  Location: [facility];  Service: Gynecology;  Laterality: N/A;  . MYRINGOTOMY    . TYMPANOSTOMY TUBE PLACEMENT  AGE 48  . WISDOM TOOTH EXTRACTION  ORAL SURG OFFICE--  1998    SOCIAL HISTORY: Social History   Tobacco Use  . Smoking status: Never Smoker  . Smokeless tobacco: Never Used  Substance Use Topics  . Alcohol use: No  . Drug use: No    FAMILY HISTORY: Family History  Problem Relation Age of Onset  . Obesity Mother   . Diabetes Father   . High blood pressure Father   . Kidney disease Father     ROS: Review of Systems  Constitutional: Positive for weight loss.  Gastrointestinal: Negative for nausea and vomiting.  Musculoskeletal:       Negative for muscle weakness  Endo/Heme/Allergies:       Negative for hypoglycemia    PHYSICAL EXAM: Pt in no acute distress  RECENT LABS AND TESTS: BMET    Component Value Date/Time   NA 138 12/28/2018 1219   K 4.3 12/28/2018 1219   CL 101 12/28/2018 1219   CO2 25 12/28/2018 1219   GLUCOSE 86 12/28/2018 1219   BUN 7 12/28/2018 1219   CREATININE 0.71 12/28/2018 1219   CALCIUM 9.2 12/28/2018 1219   GFRNONAA 102 12/28/2018 1219   GFRAA 118 12/28/2018 1219   Lab Results  Component Value Date   HGBA1C 5.7 (H) 12/28/2018   Lab Results  Component Value Date   INSULIN 18.9 12/28/2018   CBC    Component Value Date/Time   HGB 11.7 (L) 02/22/2012 1000   Iron/TIBC/Ferritin/ %Sat No results found for: IRON, TIBC, FERRITIN, IRONPCTSAT Lipid Panel  No results  found for: CHOL, TRIG, HDL, CHOLHDL, VLDL, LDLCALC, LDLDIRECT Hepatic Function Panel     Component Value Date/Time   PROT 7.3 12/28/2018 1219   ALBUMIN 4.0 12/28/2018 1219   AST 22 12/28/2018 1219   ALT 22 12/28/2018 1219   ALKPHOS 77 12/28/2018 1219   BILITOT 0.3 12/28/2018 1219      Component Value Date/Time   TSH 4.100 12/28/2018 1219   TSH 2.56 08/20/2014 1030    Results for Bonelli, Carling R (MRN [id number]) as of 05/11/2019 16:03  Ref. Range 12/28/2018 12:19  Vitamin D, 25-Hydroxy Latest Ref Range: 30.0 - 100.0 ng/mL 11.0 (L)    I, Doreene Nest, am acting as Location manager for [employer]. Owens Shark, DO  I have reviewed the above documentation for accuracy and completeness, and I agree with the above. -Jearld Lesch,  DO

## 2019-05-25 ENCOUNTER — Encounter (INDEPENDENT_AMBULATORY_CARE_PROVIDER_SITE_OTHER): Payer: Self-pay | Admitting: Bariatrics

## 2019-05-25 ENCOUNTER — Other Ambulatory Visit: Payer: Self-pay

## 2019-05-25 ENCOUNTER — Ambulatory Visit (INDEPENDENT_AMBULATORY_CARE_PROVIDER_SITE_OTHER): Payer: BC Managed Care – PPO | Admitting: Bariatrics

## 2019-05-25 DIAGNOSIS — Z6841 Body Mass Index (BMI) 40.0 and over, adult: Secondary | ICD-10-CM

## 2019-05-25 DIAGNOSIS — R7303 Prediabetes: Secondary | ICD-10-CM | POA: Diagnosis not present

## 2019-05-25 DIAGNOSIS — E559 Vitamin D deficiency, unspecified: Secondary | ICD-10-CM | POA: Diagnosis not present

## 2019-05-29 NOTE — Progress Notes (Signed)
Office: [phone]  /  Fax: [phone] TeleHealth Visit:  [name] has verbally consented to this TeleHealth visit today. The patient is located at work, the provider is located at the [employer] [facility] office. The participants in this visit include the listed provider and patient and any and all parties involved. The visit was conducted today via WebEx.  HPI:   Chief Complaint: OBESITY Joan [name] is here to discuss her progress with her obesity treatment plan. She is on the Category 3 plan and is following her eating plan approximately 75 % of the time. She states she is exercising to a workout video 30 minutes 1 to 2 times per week. Marc states that she has lost 4 pounds (weight 312 lbs from 05/24/19).  We were unable to weigh the patient today for this TeleHealth visit. She feels as if she has lost weight since her last visit. She has lost 12 lbs since starting treatment with [name].  Vitamin D deficiency Joan [name] has a diagnosis of vitamin D deficiency. She is currently taking vit D and denies nausea, vomiting or muscle weakness.  Pre-Diabetes Joan [name] has a diagnosis of prediabetes based on her elevated Hgb A1c and was informed this puts her at greater risk of developing diabetes. Her last A1c was at 5.7 and last insulin level was at 18.9 She is not taking medications currently and continues to work on diet and exercise to decrease risk of diabetes. She denies nausea or hypoglycemia.  ASSESSMENT AND PLAN:  Vitamin D deficiency  Prediabetes  Class 3 severe obesity with serious comorbidity and body mass index (BMI) of 45.0 to 49.9 in adult, unspecified obesity type ([name])  PLAN:  Vitamin D Deficiency [name] was informed that low vitamin D levels contributes to fatigue and are associated with obesity, breast, and colon cancer. She will continue to take prescription Vit D @50 ,000 IU every week and will follow up for routine testing of vitamin D, at least 2-3 times per year.  She was informed of the risk of over-replacement of vitamin D and agrees to not increase her dose unless she discusses this with [name] first.  Pre-Diabetes Joan [name] will continue to work on weight loss, exercise, increasing lean protein and decreasing simple carbohydrates in her diet to help decrease the risk of diabetes. She was informed that eating too many simple carbohydrates or too many calories at one sitting increases the likelihood of GI side effects. Joan [name] agreed to follow up with [name] as directed to monitor her progress.  Obesity Joan [name] is currently in the action stage of change. As such, her goal is to continue with weight loss efforts She has agreed to follow the Category 3 plan Joan [name] will continue the workout videos for weight loss and overall health benefits. We discussed the following Behavioral Modification Strategies today: increase H2O intake, no skipping meals, keeping healthy foods in the home, increasing lean protein intake, decreasing simple carbohydrates, increasing vegetables, decrease eating out and work on meal planning and easy cooking plans Joan [name] will weigh herself at home until she returns to the office.  Joan [name] has agreed to follow up with our clinic in 2 weeks. She was informed of the importance of frequent follow up visits to maximize her success with intensive lifestyle modifications for her multiple health conditions.  ALLERGIES: No Known Allergies  MEDICATIONS: Current Outpatient Medications on File Prior to Visit  Medication Sig Dispense Refill  . dicyclomine (BENTYL) 20 MG tablet Take 20 mg by mouth as needed for  spasms.    . mesalamine (PENTASA) 500 MG CR capsule Take 500 mg by mouth 2 (two) times daily.     . Vitamin D, Ergocalciferol, (DRISDOL) 1.25 MG (50000 UT) CAPS capsule Take 1 capsule (50,000 Units total) by mouth every 7 (seven) days. 4 capsule 0   No current facility-administered medications on file prior to visit.     PAST MEDICAL HISTORY:  Past Medical History:  Diagnosis Date  . Abnormal Pap smear   . Anemia   . Back pain   . Cancer [facility]) 2013   cervical   . Crohn's disease ([place])   . Joint pain   . Lactose intolerance   . Vitamin D deficiency     PAST SURGICAL HISTORY: Past Surgical History:  Procedure Laterality Date  . ABDOMINAL HYSTERECTOMY  2013  . CERVICAL CONE BIOPSY    . HYSTEROSCOPY    . LEEP  02/22/2012   Procedure: LOOP ELECTROSURGICAL EXCISION PROCEDURE (LEEP);  Surgeon: Thurnell Lose, MD;  Location: [facility];  Service: Gynecology;  Laterality: N/A;  . MYRINGOTOMY    . TYMPANOSTOMY TUBE PLACEMENT  AGE 41  . WISDOM TOOTH EXTRACTION  ORAL SURG OFFICE--  1998    SOCIAL HISTORY: Social History   Tobacco Use  . Smoking status: Never Smoker  . Smokeless tobacco: Never Used  Substance Use Topics  . Alcohol use: No  . Drug use: No    FAMILY HISTORY: Family History  Problem Relation Age of Onset  . Obesity Mother   . Diabetes Father   . High blood pressure Father   . Kidney disease Father     ROS: ROS  PHYSICAL EXAM: Pt in no acute distress  RECENT LABS AND TESTS: BMET    Component Value Date/Time   NA 138 12/28/2018 1219   K 4.3 12/28/2018 1219   CL 101 12/28/2018 1219   CO2 25 12/28/2018 1219   GLUCOSE 86 12/28/2018 1219   BUN 7 12/28/2018 1219   CREATININE 0.71 12/28/2018 1219   CALCIUM 9.2 12/28/2018 1219   GFRNONAA 102 12/28/2018 1219   GFRAA 118 12/28/2018 1219   Lab Results  Component Value Date   HGBA1C 5.7 (H) 12/28/2018   Lab Results  Component Value Date   INSULIN 18.9 12/28/2018   CBC    Component Value Date/Time   HGB 11.7 (L) 02/22/2012 1000   Iron/TIBC/Ferritin/ %Sat No results found for: IRON, TIBC, FERRITIN, IRONPCTSAT Lipid Panel  No results found for: CHOL, TRIG, HDL, CHOLHDL, VLDL, LDLCALC, LDLDIRECT Hepatic Function Panel     Component Value Date/Time   PROT 7.3 12/28/2018 1219   ALBUMIN 4.0 12/28/2018 1219   AST 22  12/28/2018 1219   ALT 22 12/28/2018 1219   ALKPHOS 77 12/28/2018 1219   BILITOT 0.3 12/28/2018 1219      Component Value Date/Time   TSH 4.100 12/28/2018 1219   TSH 2.56 08/20/2014 1030     Ref. Range 12/28/2018 12:19  Vitamin D, 25-Hydroxy Latest Ref Range: 30.0 - 100.0 ng/mL 11.0 (L)    I, Doreene Nest, am acting as Location manager for [employer]. Owens Shark, DO  I have reviewed the above documentation for accuracy and completeness, and I agree with the above. -Jearld Lesch, DO

## 2019-06-03 ENCOUNTER — Other Ambulatory Visit (INDEPENDENT_AMBULATORY_CARE_PROVIDER_SITE_OTHER): Payer: Self-pay | Admitting: Bariatrics

## 2019-06-03 DIAGNOSIS — E559 Vitamin D deficiency, unspecified: Secondary | ICD-10-CM

## 2019-06-08 ENCOUNTER — Ambulatory Visit (INDEPENDENT_AMBULATORY_CARE_PROVIDER_SITE_OTHER): Payer: BC Managed Care – PPO | Admitting: Bariatrics

## 2019-06-08 ENCOUNTER — Other Ambulatory Visit: Payer: Self-pay

## 2019-06-08 ENCOUNTER — Encounter (INDEPENDENT_AMBULATORY_CARE_PROVIDER_SITE_OTHER): Payer: Self-pay | Admitting: Bariatrics

## 2019-06-08 DIAGNOSIS — E559 Vitamin D deficiency, unspecified: Secondary | ICD-10-CM | POA: Diagnosis not present

## 2019-06-08 DIAGNOSIS — Z6841 Body Mass Index (BMI) 40.0 and over, adult: Secondary | ICD-10-CM | POA: Diagnosis not present

## 2019-06-08 DIAGNOSIS — R7303 Prediabetes: Secondary | ICD-10-CM

## 2019-06-08 MED ORDER — VITAMIN D (ERGOCALCIFEROL) 1.25 MG (50000 UNIT) PO CAPS: 50000.0000 [IU] | ORAL_CAPSULE | ORAL | 0 refills | Status: DC

## 2019-06-12 ENCOUNTER — Encounter (INDEPENDENT_AMBULATORY_CARE_PROVIDER_SITE_OTHER): Payer: Self-pay | Admitting: Bariatrics

## 2019-06-12 NOTE — Progress Notes (Signed)
Office: [phone]  /  Fax: [phone] TeleHealth Visit:  Joan [name] has verbally consented to this TeleHealth visit today. The patient is located at home, the provider is located at the [employer] [facility] office. The participants in this visit include the listed provider and patient and any and all parties involved. The visit was conducted today via WebEx.  HPI:   Chief Complaint: OBESITY Joan [name] is here to discuss her progress with her obesity treatment plan. She is on the Category 3 plan and is following her eating plan approximately 70 to 75 % of the time. She states she is walking 30 minutes 2 times per week. Joan [name] states that she has lost 1 pounds (weight 311 lbs). She is not sleeping well. Joan [name] is working on getting in her protein. We were unable to weigh the patient today for this TeleHealth visit. She feels as if she has lost weight since her last visit. She has lost 13 lbs since starting treatment with [name].  Vitamin D deficiency Joan [name] has a diagnosis of vitamin D deficiency. Her last vitamin D level was at 11.0 She is currently taking vit D and denies nausea, vomiting or muscle weakness.  Pre-Diabetes Joan [name] has a diagnosis of prediabetes based on her elevated Hgb A1c and was informed this puts her at greater risk of developing diabetes. Her last A1c was at 5.7 and last insulin level was at 18.9 She is not taking medications currently and continues to work on diet and exercise to decrease risk of diabetes. She denies nausea or hypoglycemia.  ASSESSMENT AND PLAN:  Vitamin D deficiency - Plan: Vitamin D, Ergocalciferol, (DRISDOL) 1.25 MG (50000 UT) CAPS capsule  Prediabetes  Class 3 severe obesity with serious comorbidity and body mass index (BMI) of 45.0 to 49.9 in adult, unspecified obesity type ([place])  PLAN:  Vitamin D Deficiency Joan [name] was informed that low vitamin D levels contributes to fatigue and are associated with obesity, breast, and colon cancer.  She agrees to continue to take prescription Vit D @50 ,000 IU every week #4 with no refills and will follow up for routine testing of vitamin D, at least 2-3 times per year. She was informed of the risk of over-replacement of vitamin D and agrees to not increase her dose unless she discusses this with [name] first. Joan [name] agrees to follow up as directed.  Pre-Diabetes Joan [name] will continue to work on weight loss, exercise, increasing lean protein and decreasing simple carbohydrates in her diet to help decrease the risk of diabetes. She was informed that eating too many simple carbohydrates or too many calories at one sitting increases the likelihood of GI side effects. Joan [name] agreed to follow up with [name] as directed to monitor her progress.  Obesity Joan [name] is currently in the action stage of change. As such, her goal is to continue with weight loss efforts She has agreed to follow the Category 3 plan Joan [name] will continue exercise (work-out classes) for weight loss and overall health benefits. Joan [name] is getting a stationary bike. We discussed the following Behavioral Modification Strategies today: increase H2O intake, no skipping meals, keeping healthy foods in the home, increasing lean protein intake, decreasing simple carbohydrates, increasing vegetables, decrease eating out and work on meal planning and easy cooking plans Joan [name] will weigh herself at home until she returns to the office.  Joan [name] has agreed to follow up with our clinic in 2 weeks. She was informed of the importance of frequent follow up visits to maximize her success  with intensive lifestyle modifications for her multiple health conditions.  ALLERGIES: No Known Allergies  MEDICATIONS: Current Outpatient Medications on File Prior to Visit  Medication Sig Dispense Refill  . dicyclomine (BENTYL) 20 MG tablet Take 20 mg by mouth as needed for spasms.    . mesalamine (PENTASA) 500 MG CR capsule Take 500 mg by mouth 2 (two) times daily.       No current facility-administered medications on file prior to visit.     PAST MEDICAL HISTORY: Past Medical History:  Diagnosis Date  . Abnormal Pap smear   . Anemia   . Back pain   . Cancer [facility]) 2013   cervical   . Crohn's disease ([place])   . Joint pain   . Lactose intolerance   . Vitamin D deficiency     PAST SURGICAL HISTORY: Past Surgical History:  Procedure Laterality Date  . ABDOMINAL HYSTERECTOMY  2013  . CERVICAL CONE BIOPSY    . HYSTEROSCOPY    . LEEP  02/22/2012   Procedure: LOOP ELECTROSURGICAL EXCISION PROCEDURE (LEEP);  Surgeon: Thurnell Lose, MD;  Location: [facility];  Service: Gynecology;  Laterality: N/A;  . MYRINGOTOMY    . TYMPANOSTOMY TUBE PLACEMENT  AGE 87  . WISDOM TOOTH EXTRACTION  ORAL SURG OFFICE--  1998    SOCIAL HISTORY: Social History   Tobacco Use  . Smoking status: Never Smoker  . Smokeless tobacco: Never Used  Substance Use Topics  . Alcohol use: No  . Drug use: No    FAMILY HISTORY: Family History  Problem Relation Age of Onset  . Obesity Mother   . Diabetes Father   . High blood pressure Father   . Kidney disease Father     ROS: Review of Systems  Constitutional: Positive for weight loss.  Gastrointestinal: Negative for nausea and vomiting.  Musculoskeletal:       Negative for muscle weakness  Endo/Heme/Allergies:       Negative for hypoglycemia  Psychiatric/Behavioral: The patient has insomnia.     PHYSICAL EXAM: Pt in no acute distress  RECENT LABS AND TESTS: BMET    Component Value Date/Time   NA 138 12/28/2018 1219   K 4.3 12/28/2018 1219   CL 101 12/28/2018 1219   CO2 25 12/28/2018 1219   GLUCOSE 86 12/28/2018 1219   BUN 7 12/28/2018 1219   CREATININE 0.71 12/28/2018 1219   CALCIUM 9.2 12/28/2018 1219   GFRNONAA 102 12/28/2018 1219   GFRAA 118 12/28/2018 1219   Lab Results  Component Value Date   HGBA1C 5.7 (H) 12/28/2018   Lab Results  Component Value Date   INSULIN 18.9  12/28/2018   CBC    Component Value Date/Time   HGB 11.7 (L) 02/22/2012 1000   Iron/TIBC/Ferritin/ %Sat No results found for: IRON, TIBC, FERRITIN, IRONPCTSAT Lipid Panel  No results found for: CHOL, TRIG, HDL, CHOLHDL, VLDL, LDLCALC, LDLDIRECT Hepatic Function Panel     Component Value Date/Time   PROT 7.3 12/28/2018 1219   ALBUMIN 4.0 12/28/2018 1219   AST 22 12/28/2018 1219   ALT 22 12/28/2018 1219   ALKPHOS 77 12/28/2018 1219   BILITOT 0.3 12/28/2018 1219      Component Value Date/Time   TSH 4.100 12/28/2018 1219   TSH 2.56 08/20/2014 1030     Ref. Range 12/28/2018 12:19  Vitamin D, 25-Hydroxy Latest Ref Range: 30.0 - 100.0 ng/mL 11.0 (L)    I, Doreene Nest, am acting as Location manager for [employer]. Owens Shark, DO  I have reviewed the above documentation for accuracy and completeness, and I agree with the above. -Jearld Lesch, DO

## 2019-06-22 ENCOUNTER — Telehealth (INDEPENDENT_AMBULATORY_CARE_PROVIDER_SITE_OTHER): Payer: BC Managed Care – PPO | Admitting: Bariatrics

## 2019-06-22 ENCOUNTER — Other Ambulatory Visit: Payer: Self-pay

## 2019-06-22 ENCOUNTER — Encounter (INDEPENDENT_AMBULATORY_CARE_PROVIDER_SITE_OTHER): Payer: Self-pay | Admitting: Bariatrics

## 2019-06-22 DIAGNOSIS — E559 Vitamin D deficiency, unspecified: Secondary | ICD-10-CM

## 2019-06-22 DIAGNOSIS — Z6841 Body Mass Index (BMI) 40.0 and over, adult: Secondary | ICD-10-CM | POA: Diagnosis not present

## 2019-06-22 DIAGNOSIS — R7303 Prediabetes: Secondary | ICD-10-CM | POA: Diagnosis not present

## 2019-06-27 NOTE — Progress Notes (Signed)
Office: [phone]  /  Fax: [phone] TeleHealth Visit:  Joan [name] has verbally consented to this TeleHealth visit today. The patient is located at home, the provider is located at the [employer] [facility] office. The participants in this visit include the listed provider and patient and any and all parties involved. The visit was conducted today via WebEx.  HPI:   Chief Complaint: OBESITY Joan [name] is here to discuss her progress with her obesity treatment plan. She is on the Category 3 plan and is following her eating plan approximately 40 to 50 % of the time. She states she is exercising 0 minutes 0 times per week. Carola states that she has gained 3 pounds. She has been under a lot of stress, as her dad has been in the hospital. We were unable to weigh the patient today for this TeleHealth visit. She feels as if she has gained weight since her last visit. She has lost 9 lbs since starting treatment with [name].  Vitamin D deficiency Joan [name] has a diagnosis of vitamin D deficiency. Her last vitamin D level was at 11.0 She is currently taking vit D and denies nausea, vomiting or muscle weakness.  Pre-Diabetes Joan [name] has a diagnosis of prediabetes based on her elevated Hgb A1c and was informed this puts her at greater risk of developing diabetes. Her last A1c was at 5.7 and last insulin level was at 18.9 She is not on medications currently and continues to work on diet and exercise to decrease risk of diabetes. She denies polyphagia.  ASSESSMENT AND PLAN:  Vitamin D deficiency  Prediabetes  Class 3 severe obesity with serious comorbidity and body mass index (BMI) of 40.0 to 44.9 in adult, unspecified obesity type ([place])  PLAN:  Vitamin D Deficiency Jareli was informed that low vitamin D levels contributes to fatigue and are associated with obesity, breast, and colon cancer. She will continue to take prescription Vit D @50 ,000 IU every week and will follow up for routine  testing of vitamin D, at least 2-3 times per year. She was informed of the risk of over-replacement of vitamin D and agrees to not increase her dose unless she discusses this with [name] first.  Pre-Diabetes Joan [name] will continue to work on weight loss, increasing exercise and activities, increasing lean protein and decreasing simple carbohydrates in her diet to help decrease the risk of diabetes. She was informed that eating too many simple carbohydrates or too many calories at one sitting increases the likelihood of GI side effects. Joan [name] agreed to follow up with [name] as directed to monitor her progress.  Obesity Joan [name] is currently in the action stage of change. As such, her goal is to continue with weight loss efforts She has agreed to follow the Category 3 plan Joan [name] has been instructed to work up to a goal of 150 minutes of combined cardio and strengthening exercise per week for weight loss and overall health benefits. We discussed the following Behavioral Modification Strategies today: increase H2O intake, no skipping meals, keeping healthy foods in the home, increasing lean protein intake, decreasing simple carbohydrates, increasing vegetables, decrease eating out and work on meal planning and intentional eating Joan [name] will weigh herself at home.  Joan [name] has agreed to follow up with our clinic in 2 weeks. She was informed of the importance of frequent follow up visits to maximize her success with intensive lifestyle modifications for her multiple health conditions.  ALLERGIES: No Known Allergies  MEDICATIONS: Current Outpatient Medications on File  Prior to Visit  Medication Sig Dispense Refill  . dicyclomine (BENTYL) 20 MG tablet Take 20 mg by mouth as needed for spasms.    . mesalamine (PENTASA) 500 MG CR capsule Take 500 mg by mouth 2 (two) times daily.     . Vitamin D, Ergocalciferol, (DRISDOL) 1.25 MG (50000 UT) CAPS capsule Take 1 capsule (50,000 Units total) by mouth every 7 (seven)  days. 4 capsule 0   No current facility-administered medications on file prior to visit.     PAST MEDICAL HISTORY: Past Medical History:  Diagnosis Date  . Abnormal Pap smear   . Anemia   . Back pain   . Cancer [facility]) 2013   cervical   . Crohn's disease ([place])   . Joint pain   . Lactose intolerance   . Vitamin D deficiency     PAST SURGICAL HISTORY: Past Surgical History:  Procedure Laterality Date  . ABDOMINAL HYSTERECTOMY  2013  . CERVICAL CONE BIOPSY    . HYSTEROSCOPY    . LEEP  02/22/2012   Procedure: LOOP ELECTROSURGICAL EXCISION PROCEDURE (LEEP);  Surgeon: Thurnell Lose, MD;  Location: [facility];  Service: Gynecology;  Laterality: N/A;  . MYRINGOTOMY    . TYMPANOSTOMY TUBE PLACEMENT  AGE 48  . WISDOM TOOTH EXTRACTION  ORAL SURG OFFICE--  1998    SOCIAL HISTORY: Social History   Tobacco Use  . Smoking status: Never Smoker  . Smokeless tobacco: Never Used  Substance Use Topics  . Alcohol use: No  . Drug use: No    FAMILY HISTORY: Family History  Problem Relation Age of Onset  . Obesity Mother   . Diabetes Father   . High blood pressure Father   . Kidney disease Father     ROS: Review of Systems  Constitutional: Negative for weight loss.  Gastrointestinal: Negative for nausea and vomiting.  Musculoskeletal:       Negative for muscle weakness  Endo/Heme/Allergies:       Negative for polyphagia    PHYSICAL EXAM: Pt in no acute distress  RECENT LABS AND TESTS: BMET    Component Value Date/Time   NA 138 12/28/2018 1219   K 4.3 12/28/2018 1219   CL 101 12/28/2018 1219   CO2 25 12/28/2018 1219   GLUCOSE 86 12/28/2018 1219   BUN 7 12/28/2018 1219   CREATININE 0.71 12/28/2018 1219   CALCIUM 9.2 12/28/2018 1219   GFRNONAA 102 12/28/2018 1219   GFRAA 118 12/28/2018 1219   Lab Results  Component Value Date   HGBA1C 5.7 (H) 12/28/2018   Lab Results  Component Value Date   INSULIN 18.9 12/28/2018   CBC    Component Value  Date/Time   HGB 11.7 (L) 02/22/2012 1000   Iron/TIBC/Ferritin/ %Sat No results found for: IRON, TIBC, FERRITIN, IRONPCTSAT Lipid Panel  No results found for: CHOL, TRIG, HDL, CHOLHDL, VLDL, LDLCALC, LDLDIRECT Hepatic Function Panel     Component Value Date/Time   PROT 7.3 12/28/2018 1219   ALBUMIN 4.0 12/28/2018 1219   AST 22 12/28/2018 1219   ALT 22 12/28/2018 1219   ALKPHOS 77 12/28/2018 1219   BILITOT 0.3 12/28/2018 1219      Component Value Date/Time   TSH 4.100 12/28/2018 1219   TSH 2.56 08/20/2014 1030     Ref. Range 12/28/2018 12:19  Vitamin D, 25-Hydroxy Latest Ref Range: 30.0 - 100.0 ng/mL 11.0 (L)    I, Doreene Nest, am acting as Location manager for [employer]. Owens Shark, DO  I have reviewed the above documentation for accuracy and completeness, and I agree with the above. -Jearld Lesch, DO

## 2019-07-02 ENCOUNTER — Other Ambulatory Visit (INDEPENDENT_AMBULATORY_CARE_PROVIDER_SITE_OTHER): Payer: Self-pay | Admitting: Bariatrics

## 2019-07-02 DIAGNOSIS — E559 Vitamin D deficiency, unspecified: Secondary | ICD-10-CM

## 2019-07-11 ENCOUNTER — Telehealth (INDEPENDENT_AMBULATORY_CARE_PROVIDER_SITE_OTHER): Payer: BC Managed Care – PPO | Admitting: Bariatrics

## 2019-07-11 ENCOUNTER — Encounter (INDEPENDENT_AMBULATORY_CARE_PROVIDER_SITE_OTHER): Payer: Self-pay | Admitting: Bariatrics

## 2019-07-11 ENCOUNTER — Other Ambulatory Visit: Payer: Self-pay

## 2019-07-11 DIAGNOSIS — E559 Vitamin D deficiency, unspecified: Secondary | ICD-10-CM

## 2019-07-11 DIAGNOSIS — Z6841 Body Mass Index (BMI) 40.0 and over, adult: Secondary | ICD-10-CM | POA: Diagnosis not present

## 2019-07-11 DIAGNOSIS — R7303 Prediabetes: Secondary | ICD-10-CM

## 2019-07-12 NOTE — Progress Notes (Signed)
Office: [phone]  /  Fax: [phone] TeleHealth Visit:  DANYAH GUASTELLA has verbally consented to this TeleHealth visit today. The patient is located at home, the provider is located at the [employer] [facility] office. The participants in this visit include the listed provider and patient. The visit was conducted today via webex.  HPI:   Chief Complaint: OBESITY Brunilda is here to discuss her progress with her obesity treatment plan. She is on the Category 3 plan and is following her eating plan approximately 60-65 % of the time. She states she is walking 2 miles 1 time per week. Aurelie states that her weight remains the same. She is still very busy due to her Father's illness. She is trying to get in her protein. She is doing better with her water intake.  We were unable to weigh the patient today for this TeleHealth visit. She feels as if she has maintained her weight since her last visit. She has lost 9 lbs since starting treatment with [name].  Vitamin D Deficiency Kyonna has a diagnosis of vitamin D deficiency. She is currently taking prescription Vit D. Last Vit D level was 11.0. She denies nausea, vomiting or muscle weakness.  Pre-Diabetes Lenay has a diagnosis of pre-diabetes based on her elevated Hgb A1c and was informed this puts her at greater risk of developing diabetes. Last A1c was 5.7 and insulin of 18.9. She is not taking metformin currently and she denies polyphagia. She continues to work on diet and exercise to decrease risk of diabetes. She denies nausea or hypoglycemia.  ASSESSMENT AND PLAN:  Vitamin D deficiency  Prediabetes  Class 3 severe obesity with serious comorbidity and body mass index (BMI) of 40.0 to 44.9 in adult, unspecified obesity type ([place])  PLAN:  Vitamin D Deficiency Laylonie was informed that low vitamin D levels contributes to fatigue and are associated with obesity, breast, and colon cancer. Bradlee agrees to continue taking prescription  Vit D 50,000 IU every week and will follow up for routine testing of vitamin D, at least 2-3 times per year. She was informed of the risk of over-replacement of vitamin D and agrees to not increase her dose unless she discusses this with [name] first. Pluma agrees to follow up with our clinic in 2 weeks.  Pre-Diabetes Velva will continue to work on weight loss, exercise, increasing protein, and decreasing simple carbohydrates in her diet to help decrease the risk of diabetes. We dicussed metformin including benefits and risks. She was informed that eating too many simple carbohydrates or too many calories at one sitting increases the likelihood of GI side effects. Alexandera declined metformin for now and a prescription was not written today. Lunabella agrees to follow up with our clinic in 2 weeks as directed to monitor her progress.  Obesity Luella is currently in the action stage of change. As such, her goal is to continue with weight loss efforts She has agreed to follow the Category 3 plan Anglia has been instructed to work up to a goal of 150 minutes of combined cardio and strengthening exercise per week or continue walking regimen and increase over time for weight loss and overall health benefits. We discussed the following Behavioral Modification Strategies today: increasing lean protein intake, decreasing simple carbohydrates, increasing vegetables, decrease eating out, increase H20 intake, work on meal planning and easy cooking plans, no skipping meals, keeping healthy foods in the home, and planning for success   Selinda has agreed to follow up with  our clinic in 2 weeks. She was informed of the importance of frequent follow up visits to maximize her success with intensive lifestyle modifications for her multiple health conditions.  ALLERGIES: No Known Allergies  MEDICATIONS: Current Outpatient Medications on File Prior to Visit  Medication Sig Dispense Refill  . dicyclomine (BENTYL) 20 MG  tablet Take 20 mg by mouth as needed for spasms.    . mesalamine (PENTASA) 500 MG CR capsule Take 500 mg by mouth 2 (two) times daily.     . Vitamin D, Ergocalciferol, (DRISDOL) 1.25 MG (50000 UT) CAPS capsule Take 1 capsule (50,000 Units total) by mouth every 7 (seven) days. 4 capsule 0   No current facility-administered medications on file prior to visit.     PAST MEDICAL HISTORY: Past Medical History:  Diagnosis Date  . Abnormal Pap smear   . Anemia   . Back pain   . Cancer [facility]) 2013   cervical   . Crohn's disease ([place])   . Joint pain   . Lactose intolerance   . Vitamin D deficiency     PAST SURGICAL HISTORY: Past Surgical History:  Procedure Laterality Date  . ABDOMINAL HYSTERECTOMY  2013  . CERVICAL CONE BIOPSY    . HYSTEROSCOPY    . LEEP  02/22/2012   Procedure: LOOP ELECTROSURGICAL EXCISION PROCEDURE (LEEP);  Surgeon: Thurnell Lose, MD;  Location: [facility];  Service: Gynecology;  Laterality: N/A;  . MYRINGOTOMY    . TYMPANOSTOMY TUBE PLACEMENT  AGE 72  . WISDOM TOOTH EXTRACTION  ORAL SURG OFFICE--  1998    SOCIAL HISTORY: Social History   Tobacco Use  . Smoking status: Never Smoker  . Smokeless tobacco: Never Used  Substance Use Topics  . Alcohol use: No  . Drug use: No    FAMILY HISTORY: Family History  Problem Relation Age of Onset  . Obesity Mother   . Diabetes Father   . High blood pressure Father   . Kidney disease Father     ROS: Review of Systems  Constitutional: Negative for weight loss.  Gastrointestinal: Negative for nausea and vomiting.  Musculoskeletal:       Negative muscle weakness  Endo/Heme/Allergies:       Negative polyphagia    PHYSICAL EXAM: Pt in no acute distress  RECENT LABS AND TESTS: BMET    Component Value Date/Time   NA 138 12/28/2018 1219   K 4.3 12/28/2018 1219   CL 101 12/28/2018 1219   CO2 25 12/28/2018 1219   GLUCOSE 86 12/28/2018 1219   BUN 7 12/28/2018 1219   CREATININE 0.71  12/28/2018 1219   CALCIUM 9.2 12/28/2018 1219   GFRNONAA 102 12/28/2018 1219   GFRAA 118 12/28/2018 1219   Lab Results  Component Value Date   HGBA1C 5.7 (H) 12/28/2018   Lab Results  Component Value Date   INSULIN 18.9 12/28/2018   CBC    Component Value Date/Time   HGB 11.7 (L) 02/22/2012 1000   Iron/TIBC/Ferritin/ %Sat No results found for: IRON, TIBC, FERRITIN, IRONPCTSAT Lipid Panel  No results found for: CHOL, TRIG, HDL, CHOLHDL, VLDL, LDLCALC, LDLDIRECT Hepatic Function Panel     Component Value Date/Time   PROT 7.3 12/28/2018 1219   ALBUMIN 4.0 12/28/2018 1219   AST 22 12/28/2018 1219   ALT 22 12/28/2018 1219   ALKPHOS 77 12/28/2018 1219   BILITOT 0.3 12/28/2018 1219      Component Value Date/Time   TSH 4.100 12/28/2018 1219   TSH 2.56  08/20/2014 1030      I, Trixie Dredge, am acting as transcriptionist for [employer], DO  I have reviewed the above documentation for accuracy and completeness, and I agree with the above. -Jearld Lesch, DO

## 2019-07-25 ENCOUNTER — Encounter (INDEPENDENT_AMBULATORY_CARE_PROVIDER_SITE_OTHER): Payer: Self-pay | Admitting: Bariatrics

## 2019-07-25 ENCOUNTER — Telehealth (INDEPENDENT_AMBULATORY_CARE_PROVIDER_SITE_OTHER): Payer: BC Managed Care – PPO | Admitting: Bariatrics

## 2019-07-25 ENCOUNTER — Other Ambulatory Visit: Payer: Self-pay

## 2019-07-25 DIAGNOSIS — E559 Vitamin D deficiency, unspecified: Secondary | ICD-10-CM

## 2019-07-25 DIAGNOSIS — Z6841 Body Mass Index (BMI) 40.0 and over, adult: Secondary | ICD-10-CM | POA: Diagnosis not present

## 2019-07-25 DIAGNOSIS — R7303 Prediabetes: Secondary | ICD-10-CM | POA: Diagnosis not present

## 2019-07-25 NOTE — Progress Notes (Signed)
Office: [phone]  /  Fax: [phone] TeleHealth Visit:  ZAMARI BONSALL has verbally consented to this TeleHealth visit today. The patient is located at home, the provider is located at the [employer] [facility] office. The participants in this visit include the listed provider and patient. The visit was conducted today via Webex.  HPI:   Chief Complaint: OBESITY Sindia is here to discuss her progress with her obesity treatment plan. She is on the Category 3 plan and is following her eating plan approximately 75% of the time. She states she is walking 2 miles 1 time per week. Chelsei states that she is down 3 lbs (weight 313 lbs). She is doing very well with her water intake and does occasionally drink a protein drink.  We were unable to weigh the patient today for this TeleHealth visit. She feels as if she has lost 3 lbs since her last visit. She has lost 10 lbs since starting treatment with [name].  Vitamin D deficiency Starlett has a diagnosis of Vitamin D deficiency. Her last Vitamin D level was 11.0 on 12/28/2018. She is currently taking Vit D and denies nausea, vomiting or muscle weakness.  Pre-Diabetes Iyauna has a diagnosis of prediabetes based on her elevated Hgb A1c and was informed this puts her at greater risk of developing diabetes. Her last A1c was 5.7 on 12/28/2018 and insulin was 18.9. She is not taking metformin currently and continues to work on diet and exercise to decrease risk of diabetes.  ASSESSMENT AND PLAN:  Vitamin D deficiency  Prediabetes  Class 3 severe obesity with serious comorbidity and body mass index (BMI) of 40.0 to 44.9 in adult, unspecified obesity type ([place])  PLAN:  Vitamin D Deficiency Litsy was informed that low Vitamin D levels contributes to fatigue and are associated with obesity, breast, and colon cancer. She agrees to continue taking Vit D and will follow-up for routine testing of Vitamin D, at least 2-3 times per year. She was  informed of the risk of over-replacement of Vitamin D and agrees to not increase her dose unless she discusses this with [name] first. Emalyn agrees to follow-up with our clinic in 2 weeks.  Pre-Diabetes Adaria will continue to work on weight loss, exercise, and decreasing simple carbohydrates in her diet to help decrease the risk of diabetes. We dicussed metformin including benefits and risks. She was informed that eating too many simple carbohydrates or too many calories at one sitting increases the likelihood of GI side effects. Tamyra was advised to decrease carbohydrates, increase protein, and increase her exercise. She will follow-up with [name] as directed to monitor her progress.  Obesity Ayanah is currently in the action stage of change. As such, her goal is to continue with weight loss efforts. She has agreed to follow the Category 3 plan. Matia will work on meal planning, intentional eating, and increasing her protein intake. Sunya has been instructed to continue and increase her exercise for weight loss and overall health benefits. We discussed the following Behavioral Modification Strategies today: increasing lean protein intake, decreasing simple carbohydrates, increasing vegetables, increase H20 intake, decrease eating out, no skipping meals, work on meal planning and easy cooking plans, and keeping healthy foods in the home.   Oluwadara has agreed to follow-up with our clinic in 2 weeks. She was informed of the importance of frequent follow-up visits to maximize her success with intensive lifestyle modifications for her multiple health conditions.  ALLERGIES: No Known Allergies  MEDICATIONS: Current Outpatient  Medications on File Prior to Visit  Medication Sig Dispense Refill  . dicyclomine (BENTYL) 20 MG tablet Take 20 mg by mouth as needed for spasms.    . mesalamine (PENTASA) 500 MG CR capsule Take 500 mg by mouth 2 (two) times daily.     . Vitamin D, Ergocalciferol, (DRISDOL) 1.25  MG (50000 UT) CAPS capsule Take 1 capsule (50,000 Units total) by mouth every 7 (seven) days. 4 capsule 0   No current facility-administered medications on file prior to visit.     PAST MEDICAL HISTORY: Past Medical History:  Diagnosis Date  . Abnormal Pap smear   . Anemia   . Back pain   . Cancer [facility]) 2013   cervical   . Crohn's disease ([place])   . Joint pain   . Lactose intolerance   . Vitamin D deficiency     PAST SURGICAL HISTORY: Past Surgical History:  Procedure Laterality Date  . ABDOMINAL HYSTERECTOMY  2013  . CERVICAL CONE BIOPSY    . HYSTEROSCOPY    . LEEP  02/22/2012   Procedure: LOOP ELECTROSURGICAL EXCISION PROCEDURE (LEEP);  Surgeon: Thurnell Lose, MD;  Location: [facility];  Service: Gynecology;  Laterality: N/A;  . MYRINGOTOMY    . TYMPANOSTOMY TUBE PLACEMENT  AGE 48  . WISDOM TOOTH EXTRACTION  ORAL SURG OFFICE--  1998    SOCIAL HISTORY: Social History   Tobacco Use  . Smoking status: Never Smoker  . Smokeless tobacco: Never Used  Substance Use Topics  . Alcohol use: No  . Drug use: No    FAMILY HISTORY: Family History  Problem Relation Age of Onset  . Obesity Mother   . Diabetes Father   . High blood pressure Father   . Kidney disease Father    ROS: Review of Systems  Gastrointestinal: Negative for nausea and vomiting.  Musculoskeletal:       Negative for muscle weakness.   PHYSICAL EXAM: Pt in no acute distress  RECENT LABS AND TESTS: BMET    Component Value Date/Time   NA 138 12/28/2018 1219   K 4.3 12/28/2018 1219   CL 101 12/28/2018 1219   CO2 25 12/28/2018 1219   GLUCOSE 86 12/28/2018 1219   BUN 7 12/28/2018 1219   CREATININE 0.71 12/28/2018 1219   CALCIUM 9.2 12/28/2018 1219   GFRNONAA 102 12/28/2018 1219   GFRAA 118 12/28/2018 1219   Lab Results  Component Value Date   HGBA1C 5.7 (H) 12/28/2018   Lab Results  Component Value Date   INSULIN 18.9 12/28/2018   CBC    Component Value Date/Time   HGB  11.7 (L) 02/22/2012 1000   Iron/TIBC/Ferritin/ %Sat No results found for: IRON, TIBC, FERRITIN, IRONPCTSAT Lipid Panel  No results found for: CHOL, TRIG, HDL, CHOLHDL, VLDL, LDLCALC, LDLDIRECT Hepatic Function Panel     Component Value Date/Time   PROT 7.3 12/28/2018 1219   ALBUMIN 4.0 12/28/2018 1219   AST 22 12/28/2018 1219   ALT 22 12/28/2018 1219   ALKPHOS 77 12/28/2018 1219   BILITOT 0.3 12/28/2018 1219      Component Value Date/Time   TSH 4.100 12/28/2018 1219   TSH 2.56 08/20/2014 1030   Results for Sienkiewicz, Simmone R (MRN [id number]) as of 07/25/2019 13:37  Ref. Range 12/28/2018 12:19  Vitamin D, 25-Hydroxy Latest Ref Range: 30.0 - 100.0 ng/mL 11.0 (L)    I, Michaelene Song, am acting as Location manager for [employer], DO  I have reviewed the above documentation for  accuracy and completeness, and I agree with the above. -Jearld Lesch, DO

## 2019-08-08 ENCOUNTER — Telehealth (INDEPENDENT_AMBULATORY_CARE_PROVIDER_SITE_OTHER): Payer: BC Managed Care – PPO | Admitting: Bariatrics

## 2019-08-08 ENCOUNTER — Other Ambulatory Visit: Payer: Self-pay

## 2019-08-08 ENCOUNTER — Encounter (INDEPENDENT_AMBULATORY_CARE_PROVIDER_SITE_OTHER): Payer: Self-pay

## 2019-08-08 ENCOUNTER — Encounter (INDEPENDENT_AMBULATORY_CARE_PROVIDER_SITE_OTHER): Payer: Self-pay | Admitting: Bariatrics

## 2019-08-08 DIAGNOSIS — Z6841 Body Mass Index (BMI) 40.0 and over, adult: Secondary | ICD-10-CM

## 2019-08-08 DIAGNOSIS — E559 Vitamin D deficiency, unspecified: Secondary | ICD-10-CM

## 2019-08-08 DIAGNOSIS — R7303 Prediabetes: Secondary | ICD-10-CM

## 2019-08-08 MED ORDER — VITAMIN D (ERGOCALCIFEROL) 1.25 MG (50000 UNIT) PO CAPS: 50000.0000 [IU] | ORAL_CAPSULE | ORAL | 0 refills | Status: DC

## 2019-08-09 NOTE — Progress Notes (Signed)
Office: [phone]  /  Fax: [phone] TeleHealth Visit:  Joan [name] has verbally consented to this TeleHealth visit today. The patient is located at home, the provider is located at the [employer] [facility] office. The participants in this visit include the listed provider and patient and any and all parties involved. The visit was conducted today via WebEx.  HPI:   Chief Complaint: OBESITY Joan [name] is here to discuss her progress with her obesity treatment plan. She is on the Category 3 plan and is following her eating plan approximately 75 to 80 % of the time. She states she is walking 30 minutes 1 time per week. Joan [name] states that she is down 3 to 4 pounds (weight 312 lbs 08/08/19). She is drinking more water. Joan [name] has struggled slightly with the protein.  We were unable to weigh the patient today for this TeleHealth visit. She feels as if she has lost weight since her last visit. She has lost 10 lbs since starting treatment with [name].  Vitamin D deficiency Joan [name] has a diagnosis of vitamin D deficiency. Her last vitamin D level was at 11.0 She is currently taking vit D and denies nausea, vomiting or muscle weakness.  Pre-Diabetes Joan [name] has a diagnosis of prediabetes based on her elevated Hgb A1c and was informed this puts her at greater risk of developing diabetes. Her last A1c was at 5.7 and last insulin level was at 18.9 Joan [name] is not taking medications currently and she continues to work on diet and exercise to decrease risk of diabetes. She denies nausea or hypoglycemia.  ASSESSMENT AND PLAN:  Vitamin D deficiency - Plan: Vitamin D, Ergocalciferol, (DRISDOL) 1.25 MG (50000 UT) CAPS capsule  Prediabetes  Class 3 severe obesity with serious comorbidity and body mass index (BMI) of 40.0 to 44.9 in adult, unspecified obesity type ([place])  PLAN:  Vitamin D Deficiency Joan [name] was informed that low vitamin D levels contributes to fatigue and are associated with obesity,  breast, and colon cancer. She agrees to continue to take prescription Vit D @50 ,000 IU every week #4 with no refills and will follow up for routine testing of vitamin D, at least 2-3 times per year. She was informed of the risk of over-replacement of vitamin D and agrees to not increase her dose unless she discusses this with [name] first. Joan [name] agrees to follow up as directed.  Pre-Diabetes Joan [name] will continue to work on weight loss, increasing activity, increasing lean protein and decreasing simple carbohydrates in her diet to help decrease the risk of diabetes. She was informed that eating too many simple carbohydrates or too many calories at one sitting increases the likelihood of GI side effects. Jaslene agreed to follow up with [name] as directed to monitor her progress.  Obesity Joan [name] is currently in the action stage of change. As such, her goal is to continue with weight loss efforts She has agreed to follow the Category 3 plan Joan [name] will continue walking as she is able (on online) for weight loss and overall health benefits. We discussed the following Behavioral Modification Strategies today: planning for success, increase H2O intake, no skipping meals, keeping healthy foods int he home, increasing lean protein intake, decreasing simple carbohydrates, increasing vegetables, decrease eating out and work on meal planning and easy cooking plans  Joan [name] has agreed to follow up with our clinic in 2 weeks. She was informed of the importance of frequent follow up visits to maximize her success with intensive lifestyle modifications for her  multiple health conditions.  ALLERGIES: No Known Allergies  MEDICATIONS: Current Outpatient Medications on File Prior to Visit  Medication Sig Dispense Refill  . dicyclomine (BENTYL) 20 MG tablet Take 20 mg by mouth as needed for spasms.    . mesalamine (PENTASA) 500 MG CR capsule Take 500 mg by mouth 2 (two) times daily.      No current facility-administered  medications on file prior to visit.     PAST MEDICAL HISTORY: Past Medical History:  Diagnosis Date  . Abnormal Pap smear   . Anemia   . Back pain   . Cancer [facility]) 2013   cervical   . Crohn's disease ([place])   . Joint pain   . Lactose intolerance   . Vitamin D deficiency     PAST SURGICAL HISTORY: Past Surgical History:  Procedure Laterality Date  . ABDOMINAL HYSTERECTOMY  2013  . CERVICAL CONE BIOPSY    . HYSTEROSCOPY    . LEEP  02/22/2012   Procedure: LOOP ELECTROSURGICAL EXCISION PROCEDURE (LEEP);  Surgeon: Thurnell Lose, MD;  Location: [facility];  Service: Gynecology;  Laterality: N/A;  . MYRINGOTOMY    . TYMPANOSTOMY TUBE PLACEMENT  AGE 21  . WISDOM TOOTH EXTRACTION  ORAL SURG OFFICE--  1998    SOCIAL HISTORY: Social History   Tobacco Use  . Smoking status: Never Smoker  . Smokeless tobacco: Never Used  Substance Use Topics  . Alcohol use: No  . Drug use: No    FAMILY HISTORY: Family History  Problem Relation Age of Onset  . Obesity Mother   . Diabetes Father   . High blood pressure Father   . Kidney disease Father     ROS: Review of Systems  Constitutional: Positive for weight loss.  Gastrointestinal: Negative for nausea and vomiting.  Musculoskeletal:       Negative for muscle weakness  Endo/Heme/Allergies:       Negative for hypoglycemia    PHYSICAL EXAM: Pt in no acute distress  RECENT LABS AND TESTS: BMET    Component Value Date/Time   NA 138 12/28/2018 1219   K 4.3 12/28/2018 1219   CL 101 12/28/2018 1219   CO2 25 12/28/2018 1219   GLUCOSE 86 12/28/2018 1219   BUN 7 12/28/2018 1219   CREATININE 0.71 12/28/2018 1219   CALCIUM 9.2 12/28/2018 1219   GFRNONAA 102 12/28/2018 1219   GFRAA 118 12/28/2018 1219   Lab Results  Component Value Date   HGBA1C 5.7 (H) 12/28/2018   Lab Results  Component Value Date   INSULIN 18.9 12/28/2018   CBC    Component Value Date/Time   HGB 11.7 (L) 02/22/2012 1000    Iron/TIBC/Ferritin/ %Sat No results found for: IRON, TIBC, FERRITIN, IRONPCTSAT Lipid Panel  No results found for: CHOL, TRIG, HDL, CHOLHDL, VLDL, LDLCALC, LDLDIRECT Hepatic Function Panel     Component Value Date/Time   PROT 7.3 12/28/2018 1219   ALBUMIN 4.0 12/28/2018 1219   AST 22 12/28/2018 1219   ALT 22 12/28/2018 1219   ALKPHOS 77 12/28/2018 1219   BILITOT 0.3 12/28/2018 1219      Component Value Date/Time   TSH 4.100 12/28/2018 1219   TSH 2.56 08/20/2014 1030     Ref. Range 12/28/2018 12:19  Vitamin D, 25-Hydroxy Latest Ref Range: 30.0 - 100.0 ng/mL 11.0 (L)    I, Doreene Nest, am acting as Location manager for [employer]. Owens Shark, DO  I have reviewed the above documentation for accuracy and completeness, and I  agree with the above. -Jearld Lesch, DO

## 2019-08-17 DIAGNOSIS — Z0000 Encounter for general adult medical examination without abnormal findings: Secondary | ICD-10-CM | POA: Diagnosis not present

## 2019-08-17 DIAGNOSIS — R7303 Prediabetes: Secondary | ICD-10-CM | POA: Diagnosis not present

## 2019-08-17 DIAGNOSIS — K5010 Crohn's disease of large intestine without complications: Secondary | ICD-10-CM | POA: Diagnosis not present

## 2019-08-17 DIAGNOSIS — E559 Vitamin D deficiency, unspecified: Secondary | ICD-10-CM | POA: Diagnosis not present

## 2019-08-17 DIAGNOSIS — Z13220 Encounter for screening for lipoid disorders: Secondary | ICD-10-CM | POA: Diagnosis not present

## 2019-08-17 DIAGNOSIS — D649 Anemia, unspecified: Secondary | ICD-10-CM | POA: Diagnosis not present

## 2019-08-17 LAB — BASIC METABOLIC PANEL
BUN: 11 (ref 4–21)
Creatinine: 0.7 (ref 0.5–1.1)
Glucose: 86
Potassium: 4.3 (ref 3.4–5.3)
Sodium: 138 (ref 137–147)

## 2019-08-17 LAB — HEMOGLOBIN A1C: Hemoglobin A1C: 5.7

## 2019-08-17 LAB — CBC AND DIFFERENTIAL
HCT: 40 (ref 36–46)
Hemoglobin: 13.1 (ref 12.0–16.0)
Platelets: 376 (ref 150–399)
WBC: 4.8

## 2019-08-17 LAB — LIPID PANEL
Cholesterol: 195 (ref 0–200)
HDL: 5 — AB (ref 35–70)
LDL Cholesterol: 136
Triglycerides: 90 (ref 40–160)

## 2019-08-17 LAB — VITAMIN D 25 HYDROXY (VIT D DEFICIENCY, FRACTURES): Vit D, 25-Hydroxy: 28

## 2019-08-22 ENCOUNTER — Other Ambulatory Visit: Payer: Self-pay

## 2019-08-22 ENCOUNTER — Encounter (INDEPENDENT_AMBULATORY_CARE_PROVIDER_SITE_OTHER): Payer: Self-pay | Admitting: Bariatrics

## 2019-08-22 ENCOUNTER — Telehealth (INDEPENDENT_AMBULATORY_CARE_PROVIDER_SITE_OTHER): Payer: BC Managed Care – PPO | Admitting: Bariatrics

## 2019-08-22 DIAGNOSIS — E559 Vitamin D deficiency, unspecified: Secondary | ICD-10-CM

## 2019-08-22 DIAGNOSIS — Z6841 Body Mass Index (BMI) 40.0 and over, adult: Secondary | ICD-10-CM

## 2019-08-22 DIAGNOSIS — R7303 Prediabetes: Secondary | ICD-10-CM | POA: Diagnosis not present

## 2019-08-22 NOTE — Progress Notes (Signed)
Office: [phone]  /  Fax: [phone] TeleHealth Visit:  Joan [name] has verbally consented to this TeleHealth visit today. The patient is located at her dad's house, the provider is located at the [employer] [facility] office. The participants in this visit include the listed provider and patient. The visit was conducted today via Webex.  HPI:   Chief Complaint: OBESITY Joan [name] is here to discuss her progress with her obesity treatment plan. She is on the Category 3 plan and is following her eating plan approximately 70% of the time. She states she is exercising 0 minutes 0 times per week. Khamia states that her weight remains the same (313 lbs). She has been very busy. Her dad was able to come home. She states she is able to get in her protein.  We were unable to weigh the patient today for this TeleHealth visit. She feels as if she has maintained her weight since her last visit. She has lost 10 lbs since starting treatment with [name].  Pre-Diabetes Joan [name] has a diagnosis of prediabetes based on her elevated Hgb A1c and was informed this puts her at greater risk of developing diabetes. Last A1c 5.7 on 12/28/2018 with an insulin of 18.9. She is not taking metformin currently and continues to work on diet and exercise to decrease risk of diabetes. She denies nausea or hypoglycemia.  Vitamin D deficiency Joan [name] has a diagnosis of Vitamin D deficiency. Last Vitamin D in Epic 11.0; increased to 28.0 on outside labs. She is currently taking Vit D and denies nausea, vomiting or muscle weakness.  ASSESSMENT AND PLAN:  Prediabetes  Vitamin D deficiency  Class 3 severe obesity with serious comorbidity and body mass index (BMI) of 45.0 to 49.9 in adult, unspecified obesity type [name])  PLAN:  Pre-Diabetes Joan [name] will continue to work on weight loss, exercise, and decreasing simple carbohydrates in her diet to help decrease the risk of diabetes. We dicussed metformin including benefits  and risks. She was informed that eating too many simple carbohydrates or too many calories at one sitting increases the likelihood of GI side effects. Joan [name] will decrease carbohydrates, increase protein, and increase exercise. She will follow-up with [name] as directed to monitor her progress.  Vitamin D Deficiency Joan [name] was informed that low Vitamin D levels contributes to fatigue and are associated with obesity, breast, and colon cancer. She agrees to continue taking Vit D and will follow-up for routine testing of Vitamin D, at least 2-3 times per year. She was informed of the risk of over-replacement of Vitamin D and agrees to not increase her dose unless she discusses this with [name] first. Joan [name] agrees to follow-up with our clinic in 2 weeks.  Obesity Joan [name] is currently in the action stage of change. As such, her goal is to continue with weight loss efforts. She has agreed to follow the Category 3 plan. Joan [name] will work on meal planning, increasing protein, and increasing activity. Joan [name] has been instructed to exercise with workout videos 2 times per week for weight loss and overall health benefits. We discussed the following Behavioral Modification Strategies today: increasing lean protein intake, decreasing simple carbohydrates, increasing vegetables, increase H20 intake, decrease eating out, no skipping meals, work on meal planning and easy cooking plans, and keeping healthy foods in the home.  Joan [name] has agreed to follow-up with our clinic in 2 weeks. She was informed of the importance of frequent follow-up visits to maximize her success with intensive lifestyle modifications for her multiple  health conditions.  ALLERGIES: No Known Allergies  MEDICATIONS: Current Outpatient Medications on File Prior to Visit  Medication Sig Dispense Refill  . dicyclomine (BENTYL) 20 MG tablet Take 20 mg by mouth as needed for spasms.    . mesalamine (PENTASA) 500 MG CR capsule Take 500 mg by mouth 2  (two) times daily.     . Vitamin D, Ergocalciferol, (DRISDOL) 1.25 MG (50000 UT) CAPS capsule Take 1 capsule (50,000 Units total) by mouth every 7 (seven) days. 4 capsule 0   No current facility-administered medications on file prior to visit.     PAST MEDICAL HISTORY: Past Medical History:  Diagnosis Date  . Abnormal Pap smear   . Anemia   . Back pain   . Cancer [facility]) 2013   cervical   . Crohn's disease ([place])   . Joint pain   . Lactose intolerance   . Vitamin D deficiency     PAST SURGICAL HISTORY: Past Surgical History:  Procedure Laterality Date  . ABDOMINAL HYSTERECTOMY  2013  . CERVICAL CONE BIOPSY    . HYSTEROSCOPY    . LEEP  02/22/2012   Procedure: LOOP ELECTROSURGICAL EXCISION PROCEDURE (LEEP);  Surgeon: Thurnell Lose, MD;  Location: [facility];  Service: Gynecology;  Laterality: N/A;  . MYRINGOTOMY    . TYMPANOSTOMY TUBE PLACEMENT  AGE 41  . WISDOM TOOTH EXTRACTION  ORAL SURG OFFICE--  1998    SOCIAL HISTORY: Social History   Tobacco Use  . Smoking status: Never Smoker  . Smokeless tobacco: Never Used  Substance Use Topics  . Alcohol use: No  . Drug use: No    FAMILY HISTORY: Family History  Problem Relation Age of Onset  . Obesity Mother   . Diabetes Father   . High blood pressure Father   . Kidney disease Father    ROS: Review of Systems  Gastrointestinal: Negative for nausea and vomiting.  Musculoskeletal:       Negative for muscle weakness.  Endo/Heme/Allergies:       Negative for hypoglycemia.   PHYSICAL EXAM: Pt in no acute distress  RECENT LABS AND TESTS: BMET    Component Value Date/Time   NA 138 12/28/2018 1219   K 4.3 12/28/2018 1219   CL 101 12/28/2018 1219   CO2 25 12/28/2018 1219   GLUCOSE 86 12/28/2018 1219   BUN 7 12/28/2018 1219   CREATININE 0.71 12/28/2018 1219   CALCIUM 9.2 12/28/2018 1219   GFRNONAA 102 12/28/2018 1219   GFRAA 118 12/28/2018 1219   Lab Results  Component Value Date   HGBA1C 5.7  (H) 12/28/2018   Lab Results  Component Value Date   INSULIN 18.9 12/28/2018   CBC    Component Value Date/Time   HGB 11.7 (L) 02/22/2012 1000   Iron/TIBC/Ferritin/ %Sat No results found for: IRON, TIBC, FERRITIN, IRONPCTSAT Lipid Panel  No results found for: CHOL, TRIG, HDL, CHOLHDL, VLDL, LDLCALC, LDLDIRECT Hepatic Function Panel     Component Value Date/Time   PROT 7.3 12/28/2018 1219   ALBUMIN 4.0 12/28/2018 1219   AST 22 12/28/2018 1219   ALT 22 12/28/2018 1219   ALKPHOS 77 12/28/2018 1219   BILITOT 0.3 12/28/2018 1219      Component Value Date/Time   TSH 4.100 12/28/2018 1219   TSH 2.56 08/20/2014 1030   Results for Yaworski, Joan [name] (MRN [id number]) as of 08/22/2019 15:21  Ref. Range 12/28/2018 12:19  Vitamin D, 25-Hydroxy Latest Ref Range: 30.0 - 100.0 ng/mL 11.0 (L)  Joan Dk, am acting as Location manager for [employer], DO   I have reviewed the above documentation for accuracy and completeness, and I agree with the above. -Jearld Lesch, DO

## 2019-08-23 ENCOUNTER — Encounter (INDEPENDENT_AMBULATORY_CARE_PROVIDER_SITE_OTHER): Payer: Self-pay

## 2019-09-01 ENCOUNTER — Other Ambulatory Visit (INDEPENDENT_AMBULATORY_CARE_PROVIDER_SITE_OTHER): Payer: Self-pay | Admitting: Bariatrics

## 2019-09-01 DIAGNOSIS — E559 Vitamin D deficiency, unspecified: Secondary | ICD-10-CM

## 2019-09-12 ENCOUNTER — Encounter (INDEPENDENT_AMBULATORY_CARE_PROVIDER_SITE_OTHER): Payer: Self-pay | Admitting: Bariatrics

## 2019-09-12 ENCOUNTER — Other Ambulatory Visit: Payer: Self-pay

## 2019-09-12 ENCOUNTER — Ambulatory Visit (INDEPENDENT_AMBULATORY_CARE_PROVIDER_SITE_OTHER): Payer: BC Managed Care – PPO | Admitting: Bariatrics

## 2019-09-12 DIAGNOSIS — R7303 Prediabetes: Secondary | ICD-10-CM

## 2019-09-12 DIAGNOSIS — E66813 Obesity, class 3: Secondary | ICD-10-CM

## 2019-09-12 DIAGNOSIS — E559 Vitamin D deficiency, unspecified: Secondary | ICD-10-CM

## 2019-09-12 DIAGNOSIS — Z6841 Body Mass Index (BMI) 40.0 and over, adult: Secondary | ICD-10-CM | POA: Diagnosis not present

## 2019-09-13 ENCOUNTER — Encounter (INDEPENDENT_AMBULATORY_CARE_PROVIDER_SITE_OTHER): Payer: Self-pay | Admitting: Bariatrics

## 2019-09-13 NOTE — Progress Notes (Signed)
Office: [phone]  /  Fax: [phone] TeleHealth Visit:  Joan [name] has verbally consented to this TeleHealth visit today. The patient is located at home, the provider is located at the [employer] [facility] office. The participants in this visit include the listed provider and patient. The visit was conducted today via telephone call (Webex unable to connect - changed to telephone call).  HPI:   Chief Complaint: OBESITY Joan [name] is here to discuss her progress with her obesity treatment plan. She is on the Category 3 plan and is following her eating plan approximately 10-15% of the time. She states she is exercising 0 minutes 0 times per week. Joan [name] states that she has gained 2-3 lbs since her last visit. She states that she has had a Crohn's disease "flare" for 1 month.  We were unable to weigh the patient today for this TeleHealth visit. She feels as if she has gained 2-3 lbs since her last visit. She has lost 10 lbs since starting treatment with [name].  Pre-Diabetes Mailen has a diagnosis of prediabetes based on her elevated Hgb A1c and was informed this puts her at greater risk of developing diabetes. Last A1c 5.7 on 08/17/2019. She is on no medication currently and continues to work on diet and exercise to decrease risk of diabetes. She denies nausea or hypoglycemia.  Vitamin D deficiency Joan [name] has a diagnosis of Vitamin D deficiency. Last Vitamin D 28.0 on 08/17/2019. She is currently taking Vit D and denies nausea, vomiting or muscle weakness.  ASSESSMENT AND PLAN:  Vitamin D deficiency  Prediabetes  Class 3 severe obesity with serious comorbidity and body mass index (BMI) of 40.0 to 44.9 in adult, unspecified obesity type [facility])  PLAN:  Pre-Diabetes Joan [name] will continue to work on weight loss, exercise, and decreasing simple carbohydrates in her diet to help decrease the risk of diabetes. We dicussed metformin including benefits and risks. She was informed that  eating too many simple carbohydrates or too many calories at one sitting increases the likelihood of GI side effects. Joan [name] was instructed to decrease carbohydrates, increase protein, and increase activity. She will follow-up with [name] as directed to monitor her progress.  Vitamin D Deficiency Joan [name] was informed that low Vitamin D levels contributes to fatigue and are associated with obesity, breast, and colon cancer. She agrees to continue taking Vit D and will follow-up for routine testing of Vitamin D, at least 2-3 times per year. She was informed of the risk of over-replacement of Vitamin D and agrees to not increase her dose unless she discusses this with [name] first. Joan [name] agrees to follow-up with our clinic in 2 weeks.  Obesity Joan [name] is currently in the action stage of change. As such, her goal is to continue with weight loss efforts. She has agreed to follow the Category 3 plan. Joan [name] will work on meal planning (designed around foods with increased protein that she can tolerate with Crohn's "flare."). Joan [name] has been instructed to resume some exercise for weight loss and overall health benefits. We discussed the following Behavioral Modification Strategies today: increasing lean protein intake, decreasing simple carbohydrates, increasing vegetables, increase H20 intake, decrease eating out, no skipping meals, work on meal planning and easy cooking plans, keeping healthy foods in the home, and planning for success.  Joan [name] has agreed to follow up with our clinic in 2 weeks. She was informed of the importance of frequent follow up visits to maximize her success with intensive lifestyle modifications for her multiple  health conditions.  ALLERGIES: No Known Allergies  MEDICATIONS: Current Outpatient Medications on File Prior to Visit  Medication Sig Dispense Refill  . dicyclomine (BENTYL) 20 MG tablet Take 20 mg by mouth as needed for spasms.    . mesalamine (PENTASA) 500 MG CR capsule Take  500 mg by mouth 2 (two) times daily.     . Vitamin D, Ergocalciferol, (DRISDOL) 1.25 MG (50000 UT) CAPS capsule Take 1 capsule (50,000 Units total) by mouth every 7 (seven) days. 4 capsule 0   No current facility-administered medications on file prior to visit.     PAST MEDICAL HISTORY: Past Medical History:  Diagnosis Date  . Abnormal Pap smear   . Anemia   . Back pain   . Cancer [facility]) 2013   cervical   . Crohn's disease ([place])   . Joint pain   . Lactose intolerance   . Vitamin D deficiency     PAST SURGICAL HISTORY: Past Surgical History:  Procedure Laterality Date  . ABDOMINAL HYSTERECTOMY  2013  . CERVICAL CONE BIOPSY    . HYSTEROSCOPY    . LEEP  02/22/2012   Procedure: LOOP ELECTROSURGICAL EXCISION PROCEDURE (LEEP);  Surgeon: Thurnell Lose, MD;  Location: [facility];  Service: Gynecology;  Laterality: N/A;  . MYRINGOTOMY    . TYMPANOSTOMY TUBE PLACEMENT  AGE 42  . WISDOM TOOTH EXTRACTION  ORAL SURG OFFICE--  1998    SOCIAL HISTORY: Social History   Tobacco Use  . Smoking status: Never Smoker  . Smokeless tobacco: Never Used  Substance Use Topics  . Alcohol use: No  . Drug use: No    FAMILY HISTORY: Family History  Problem Relation Age of Onset  . Obesity Mother   . Diabetes Father   . High blood pressure Father   . Kidney disease Father    ROS: Review of Systems  Gastrointestinal: Negative for nausea and vomiting.  Musculoskeletal:       Negative for muscle weakness.  Endo/Heme/Allergies:       Negative for hypoglycemia.   PHYSICAL EXAM: Pt in no acute distress  RECENT LABS AND TESTS: BMET    Component Value Date/Time   NA 138 08/17/2019   K 4.3 08/17/2019   CL 101 12/28/2018 1219   CO2 25 12/28/2018 1219   GLUCOSE 86 12/28/2018 1219   BUN 11 08/17/2019   CREATININE 0.7 08/17/2019   CREATININE 0.71 12/28/2018 1219   CALCIUM 9.2 12/28/2018 1219   GFRNONAA 102 12/28/2018 1219   GFRAA 118 12/28/2018 1219   Lab Results   Component Value Date   HGBA1C 5.7 08/17/2019   HGBA1C 5.7 (H) 12/28/2018   Lab Results  Component Value Date   INSULIN 18.9 12/28/2018   CBC    Component Value Date/Time   WBC 4.8 08/17/2019   HGB 13.1 08/17/2019   HCT 40 08/17/2019   PLT 376 08/17/2019   Iron/TIBC/Ferritin/ %Sat No results found for: IRON, TIBC, FERRITIN, IRONPCTSAT Lipid Panel     Component Value Date/Time   CHOL 195 08/17/2019   TRIG 90 08/17/2019   HDL 5 (A) 08/17/2019   LDLCALC 136 08/17/2019   Hepatic Function Panel     Component Value Date/Time   PROT 7.3 12/28/2018 1219   ALBUMIN 4.0 12/28/2018 1219   AST 22 12/28/2018 1219   ALT 22 12/28/2018 1219   ALKPHOS 77 12/28/2018 1219   BILITOT 0.3 12/28/2018 1219      Component Value Date/Time   TSH 4.100 12/28/2018  1219   TSH 2.56 08/20/2014 1030   Results for LUGENE, HITT R (MRN [id number]) as of 09/13/2019 10:39  Ref. Range 08/17/2019 00:00  Vitamin D, 25-Hydroxy Unknown 28.0   I, Joan [name], am acting as Location manager for [employer], DO  I have reviewed the above documentation for accuracy and completeness, and I agree with the above. -Jearld Lesch, DO

## 2019-09-26 ENCOUNTER — Encounter (INDEPENDENT_AMBULATORY_CARE_PROVIDER_SITE_OTHER): Payer: Self-pay | Admitting: Bariatrics

## 2019-09-26 ENCOUNTER — Other Ambulatory Visit: Payer: Self-pay

## 2019-09-26 ENCOUNTER — Ambulatory Visit (INDEPENDENT_AMBULATORY_CARE_PROVIDER_SITE_OTHER): Payer: BC Managed Care – PPO | Admitting: Bariatrics

## 2019-09-26 DIAGNOSIS — R7303 Prediabetes: Secondary | ICD-10-CM

## 2019-09-26 DIAGNOSIS — Z6841 Body Mass Index (BMI) 40.0 and over, adult: Secondary | ICD-10-CM

## 2019-09-26 DIAGNOSIS — E786 Lipoprotein deficiency: Secondary | ICD-10-CM

## 2019-09-26 NOTE — Progress Notes (Signed)
Office: [phone]  /  Fax: [phone] TeleHealth Visit:  Joan [name] has verbally consented to this TeleHealth visit today. The patient is located at home, the provider is located at the [employer] [facility] office. The participants in this visit include the listed provider and patient. The visit was conducted today via Webex.  HPI:   Chief Complaint: OBESITY Joan [name] is here to discuss her progress with her obesity treatment plan. She is on the Category 3 plan and is following her eating plan approximately 0% of the time. She states she is exercising 0 minutes 0 times per week. Joan [name] states she is down 2 lbs. She had a "Crohn's" flare-up and "more time in the bathroom." We were unable to weigh the patient today for this TeleHealth visit. She feels as if she has lost 2 lbs since her last visit. She has lost 10 lbs since starting treatment with [name].  Low HDL Joan [name] has an HDL level of 4.2 with the rest of the lipid panel essentially okay.  Pre-Diabetes Joan [name] has a diagnosis of prediabetes based on her elevated Hgb A1c and was informed this puts her at greater risk of developing diabetes. She is not taking metformin currently and continues to work on diet and exercise to decrease risk of diabetes. She denies nausea or hypoglycemia. No polyphagia.  ASSESSMENT AND PLAN:  Low HDL (under 40)  Prediabetes  Class 3 severe obesity with serious comorbidity and body mass index (BMI) of 40.0 to 44.9 in adult, unspecified obesity type ([place])  PLAN:  Low HDL Joan [name] was advised to increase exercise, decrease carbohydrates, and consider a low carb diet.  Pre-Diabetes Joan [name] will continue to work on weight loss, exercise, and decreasing simple carbohydrates in her diet to help decrease the risk of diabetes. We dicussed metformin including benefits and risks. She was informed that eating too many simple carbohydrates or too many calories at one sitting increases the likelihood of GI  side effects. Joan [name] was instructed to decrease carbohydrates, increase protein and healthy fats, and increase activity. She will follow-up with [name] as directed to monitor her progress.  Obesity Joan [name] is currently in the action stage of change. As such, her goal is to continue with weight loss efforts. She has agreed to follow the Category 3 plan. Joan [name] will work on meal planning, intentional eating, and increase her water intake. She will try protein shakes (no whey protein), and will gradually increase fruit and vegetables. Joan [name] has been instructed to work up to a goal of 150 minutes of combined cardio and strengthening exercise per week for weight loss and overall health benefits. We discussed the following Behavioral Modification Strategies today: increasing lean protein intake, decreasing simple carbohydrates, increasing vegetables, increase H20 intake, decrease eating out, no skipping meals, work on meal planning and easy cooking plans, keeping healthy foods in the home, and planning for success.  Joan [name] has agreed to follow-up with our clinic in 2 weeks. She was informed of the importance of frequent follow-up visits to maximize her success with intensive lifestyle modifications for her multiple health conditions.  ALLERGIES: No Known Allergies  MEDICATIONS: Current Outpatient Medications on File Prior to Visit  Medication Sig Dispense Refill  . dicyclomine (BENTYL) 20 MG tablet Take 20 mg by mouth as needed for spasms.    . mesalamine (PENTASA) 500 MG CR capsule Take 500 mg by mouth 2 (two) times daily.     . Vitamin D, Ergocalciferol, (DRISDOL) 1.25 MG (50000 UT) CAPS capsule Take  1 capsule (50,000 Units total) by mouth every 7 (seven) days. 4 capsule 0   No current facility-administered medications on file prior to visit.     PAST MEDICAL HISTORY: Past Medical History:  Diagnosis Date  . Abnormal Pap smear   . Anemia   . Back pain   . Cancer [facility]) 2013   cervical   .  Crohn's disease ([place])   . Joint pain   . Lactose intolerance   . Vitamin D deficiency     PAST SURGICAL HISTORY: Past Surgical History:  Procedure Laterality Date  . ABDOMINAL HYSTERECTOMY  2013  . CERVICAL CONE BIOPSY    . HYSTEROSCOPY    . LEEP  02/22/2012   Procedure: LOOP ELECTROSURGICAL EXCISION PROCEDURE (LEEP);  Surgeon: Thurnell Lose, MD;  Location: [facility];  Service: Gynecology;  Laterality: N/A;  . MYRINGOTOMY    . TYMPANOSTOMY TUBE PLACEMENT  AGE 31  . WISDOM TOOTH EXTRACTION  ORAL SURG OFFICE--  1998    SOCIAL HISTORY: Social History   Tobacco Use  . Smoking status: Never Smoker  . Smokeless tobacco: Never Used  Substance Use Topics  . Alcohol use: No  . Drug use: No    FAMILY HISTORY: Family History  Problem Relation Age of Onset  . Obesity Mother   . Diabetes Father   . High blood pressure Father   . Kidney disease Father    ROS: Review of Systems  Gastrointestinal: Negative for nausea.  Endo/Heme/Allergies:       Negative for hypoglycemia. Negative for polyphagia.   PHYSICAL EXAM: Pt in no acute distress  RECENT LABS AND TESTS: BMET    Component Value Date/Time   NA 138 08/17/2019   K 4.3 08/17/2019   CL 101 12/28/2018 1219   CO2 25 12/28/2018 1219   GLUCOSE 86 12/28/2018 1219   BUN 11 08/17/2019   CREATININE 0.7 08/17/2019   CREATININE 0.71 12/28/2018 1219   CALCIUM 9.2 12/28/2018 1219   GFRNONAA 102 12/28/2018 1219   GFRAA 118 12/28/2018 1219   Lab Results  Component Value Date   HGBA1C 5.7 08/17/2019   HGBA1C 5.7 (H) 12/28/2018   Lab Results  Component Value Date   INSULIN 18.9 12/28/2018   CBC    Component Value Date/Time   WBC 4.8 08/17/2019   HGB 13.1 08/17/2019   HCT 40 08/17/2019   PLT 376 08/17/2019   Iron/TIBC/Ferritin/ %Sat No results found for: IRON, TIBC, FERRITIN, IRONPCTSAT Lipid Panel     Component Value Date/Time   CHOL 195 08/17/2019   TRIG 90 08/17/2019   HDL 5 (A) 08/17/2019    LDLCALC 136 08/17/2019   Hepatic Function Panel     Component Value Date/Time   PROT 7.3 12/28/2018 1219   ALBUMIN 4.0 12/28/2018 1219   AST 22 12/28/2018 1219   ALT 22 12/28/2018 1219   ALKPHOS 77 12/28/2018 1219   BILITOT 0.3 12/28/2018 1219      Component Value Date/Time   TSH 4.100 12/28/2018 1219   TSH 2.56 08/20/2014 1030   Results for Buell, Embry R (MRN [id number]) as of 09/26/2019 16:03  Ref. Range 08/17/2019 00:00  Vitamin D, 25-Hydroxy Unknown 28.0   I, Michaelene Song, am acting as Location manager for [employer], DO   I have reviewed the above documentation for accuracy and completeness, and I agree with the above. -Jearld Lesch, DO

## 2019-09-27 ENCOUNTER — Encounter (INDEPENDENT_AMBULATORY_CARE_PROVIDER_SITE_OTHER): Payer: Self-pay | Admitting: Bariatrics

## 2019-10-10 ENCOUNTER — Ambulatory Visit (INDEPENDENT_AMBULATORY_CARE_PROVIDER_SITE_OTHER): Payer: BC Managed Care – PPO | Admitting: Bariatrics

## 2019-10-10 ENCOUNTER — Encounter (INDEPENDENT_AMBULATORY_CARE_PROVIDER_SITE_OTHER): Payer: Self-pay | Admitting: Bariatrics

## 2019-10-10 ENCOUNTER — Other Ambulatory Visit: Payer: Self-pay

## 2019-10-10 DIAGNOSIS — K50919 Crohn's disease, unspecified, with unspecified complications: Secondary | ICD-10-CM | POA: Diagnosis not present

## 2019-10-10 DIAGNOSIS — R7303 Prediabetes: Secondary | ICD-10-CM | POA: Diagnosis not present

## 2019-10-10 DIAGNOSIS — E559 Vitamin D deficiency, unspecified: Secondary | ICD-10-CM | POA: Diagnosis not present

## 2019-10-10 DIAGNOSIS — Z6841 Body Mass Index (BMI) 40.0 and over, adult: Secondary | ICD-10-CM

## 2019-10-11 ENCOUNTER — Encounter (INDEPENDENT_AMBULATORY_CARE_PROVIDER_SITE_OTHER): Payer: Self-pay | Admitting: Bariatrics

## 2019-10-11 NOTE — Progress Notes (Signed)
Office: [phone]  /  Fax: [phone] TeleHealth Visit:  [name] has verbally consented to this TeleHealth visit today. The patient is located at home, the provider is located at the [employer] [facility] office. The participants in this visit include the listed provider and patient. The visit was conducted today via Webex.  HPI:   Chief Complaint: OBESITY Joan [name] is here to discuss her progress with her obesity treatment plan. She is on the Category 3 plan and is following her eating plan approximately 0% of the time. She states she is exercising 0 minutes 0 times per week. Joan [name] states that her weight has fluctuated since the last visit. She states that she is getting in more protein now and is getting better with her water intake. We were unable to weigh the patient today for this TeleHealth visit. She feels as if her weight has fluctuated since her last visit. She has lost 10 lbs since starting treatment with [name].  Pre-Diabetes Joan [name] has a diagnosis of prediabetes based on her elevated Hgb A1c and was informed this puts her at greater risk of developing diabetes. Last A1c 5.7 on 08/17/2019 with an insulin of 18.9 on 12/28/2018.  She is not taking metformin currently and continues to work on diet and exercise to decrease risk of diabetes. She denies nausea or hypoglycemia. No polyphagia.  Crohn's Disease Joan [name] has a diagnosis of Crohn's disease and is taking Pentasa. She states it has impacted what she can eat and reports she is feeling slightly better.  Vitamin D deficiency [name] has a diagnosis of Vitamin D deficiency. Last Vitamin D 28.0 on 08/17/2019. Vitamin D is slow to go up secondary to Crohn's disease. She is currently taking Vit D and denies nausea, vomiting or muscle weakness.  ASSESSMENT AND PLAN:  Prediabetes  Crohn's disease with complication, unspecified gastrointestinal tract location [name])  Vitamin D deficiency  Class 3 severe obesity with  serious comorbidity and body mass index (BMI) of 45.0 to 49.9 in adult, unspecified obesity type [facility])  PLAN:  Pre-Diabetes [name] will continue to work on weight loss, exercise, and decreasing simple carbohydrates in her diet to help decrease the risk of diabetes. We dicussed metformin including benefits and risks. She was informed that eating too many simple carbohydrates or too many calories at one sitting increases the likelihood of GI side effects. [name] was instructed to decrease carbohydrates, increase protein, and activity as tolerated. She will follow-up with [name] as directed to monitor her progress.  Crohn's Disease [name] will have B12 and Vitamin D checked in the future. She will avoid trigger foods and will consider multivitamin gummies.  Vitamin D Deficiency Joan [name] was informed that low Vitamin D levels contributes to fatigue and are associated with obesity, breast, and colon cancer. She agrees to continue taking Vit D and will follow-up for routine testing of Vitamin D, at least 2-3 times per year. She was informed of the risk of over-replacement of Vitamin D and agrees to not increase her dose unless she discusses this with [name] first. [name] agrees to follow-up with our clinic in 2-3 weeks.  Obesity Joan [name] is currently in the action stage of change. As such, her goal is to continue with weight loss efforts. She has agreed to follow the Category 3 plan. Joan [name] will work on meal planning, intentional eating, and increasing her water intake. Joan [name] has been instructed to work up to a goal of 150 minutes of combined cardio and strengthening exercise per week for  weight loss and overall health benefits. We discussed the following Behavioral Modification Strategies today: increasing lean protein intake, decreasing simple carbohydrates, increasing vegetables, increase H20 intake, decrease eating out, no skipping meals, work on meal planning and easy cooking plans, keeping healthy foods in  the home, and planning for success.  Joan [name] has agreed to follow-up with our clinic in 2-3 weeks. She was informed of the importance of frequent follow-up visits to maximize her success with intensive lifestyle modifications for her multiple health conditions.  ALLERGIES: No Known Allergies  MEDICATIONS: Current Outpatient Medications on File Prior to Visit  Medication Sig Dispense Refill   dicyclomine (BENTYL) 20 MG tablet Take 20 mg by mouth as needed for spasms.     mesalamine (PENTASA) 500 MG CR capsule Take 500 mg by mouth 2 (two) times daily.      Vitamin D, Ergocalciferol, (DRISDOL) 1.25 MG (50000 UT) CAPS capsule Take 1 capsule (50,000 Units total) by mouth every 7 (seven) days. 4 capsule 0   No current facility-administered medications on file prior to visit.     PAST MEDICAL HISTORY: Past Medical History:  Diagnosis Date   Abnormal Pap smear    Anemia    Back pain    Cancer ([place]) 2013   cervical    Crohn's disease ([place])    Joint pain    Lactose intolerance    Vitamin D deficiency     PAST SURGICAL HISTORY: Past Surgical History:  Procedure Laterality Date   ABDOMINAL HYSTERECTOMY  2013   CERVICAL CONE BIOPSY     HYSTEROSCOPY     LEEP  02/22/2012   Procedure: LOOP ELECTROSURGICAL EXCISION PROCEDURE (LEEP);  Surgeon: Thurnell Lose, MD;  Location: [facility];  Service: Gynecology;  Laterality: N/A;   MYRINGOTOMY     TYMPANOSTOMY TUBE PLACEMENT  AGE 48   WISDOM TOOTH EXTRACTION  ORAL SURG OFFICE--  1998    SOCIAL HISTORY: Social History   Tobacco Use   Smoking status: Never Smoker   Smokeless tobacco: Never Used  Substance Use Topics   Alcohol use: No   Drug use: No    FAMILY HISTORY: Family History  Problem Relation Age of Onset   Obesity Mother    Diabetes Father    High blood pressure Father    Kidney disease Father    ROS: Review of Systems  Gastrointestinal: Negative for nausea and vomiting.        Positive for Crohn's disease.  Musculoskeletal:       Negative for muscle weakness.  Endo/Heme/Allergies:       Negative for hypoglycemia. Negative for polyphagia.   PHYSICAL EXAM: Pt in no acute distress  RECENT LABS AND TESTS: BMET    Component Value Date/Time   NA 138 08/17/2019   K 4.3 08/17/2019   CL 101 12/28/2018 1219   CO2 25 12/28/2018 1219   GLUCOSE 86 12/28/2018 1219   BUN 11 08/17/2019   CREATININE 0.7 08/17/2019   CREATININE 0.71 12/28/2018 1219   CALCIUM 9.2 12/28/2018 1219   GFRNONAA 102 12/28/2018 1219   GFRAA 118 12/28/2018 1219   Lab Results  Component Value Date   HGBA1C 5.7 08/17/2019   HGBA1C 5.7 (H) 12/28/2018   Lab Results  Component Value Date   INSULIN 18.9 12/28/2018   CBC    Component Value Date/Time   WBC 4.8 08/17/2019   HGB 13.1 08/17/2019   HCT 40 08/17/2019   PLT 376 08/17/2019   Iron/TIBC/Ferritin/ %Sat No results found  for: IRON, TIBC, FERRITIN, IRONPCTSAT Lipid Panel     Component Value Date/Time   CHOL 195 08/17/2019   TRIG 90 08/17/2019   HDL 5 (A) 08/17/2019   LDLCALC 136 08/17/2019   Hepatic Function Panel     Component Value Date/Time   PROT 7.3 12/28/2018 1219   ALBUMIN 4.0 12/28/2018 1219   AST 22 12/28/2018 1219   ALT 22 12/28/2018 1219   ALKPHOS 77 12/28/2018 1219   BILITOT 0.3 12/28/2018 1219      Component Value Date/Time   TSH 4.100 12/28/2018 1219   TSH 2.56 08/20/2014 1030   Results for Roarty, Joleena R (MRN [id number]) as of 10/11/2019 12:11  Ref. Range 08/17/2019 00:00  Vitamin D, 25-Hydroxy Unknown 28.0   I, Michaelene Song, am acting as Location manager for [employer], DO  I have reviewed the above documentation for accuracy and completeness, and I agree with the above. -Jearld Lesch, DO

## 2019-10-31 ENCOUNTER — Ambulatory Visit (INDEPENDENT_AMBULATORY_CARE_PROVIDER_SITE_OTHER): Payer: BC Managed Care – PPO | Admitting: Bariatrics

## 2019-10-31 ENCOUNTER — Encounter (INDEPENDENT_AMBULATORY_CARE_PROVIDER_SITE_OTHER): Payer: Self-pay | Admitting: Bariatrics

## 2019-10-31 ENCOUNTER — Other Ambulatory Visit: Payer: Self-pay

## 2019-10-31 DIAGNOSIS — E559 Vitamin D deficiency, unspecified: Secondary | ICD-10-CM

## 2019-10-31 DIAGNOSIS — R7303 Prediabetes: Secondary | ICD-10-CM | POA: Diagnosis not present

## 2019-10-31 MED ORDER — VITAMIN D (ERGOCALCIFEROL) 1.25 MG (50000 UNIT) PO CAPS: 50000.0000 [IU] | ORAL_CAPSULE | ORAL | 0 refills | Status: DC

## 2019-11-01 NOTE — Progress Notes (Signed)
Office: [phone]  /  Fax: [phone] TeleHealth Visit:  EDIT [name] has verbally consented to this TeleHealth visit today. The patient is located at home, the provider is located at the [employer] [facility] office. The participants in this visit include the listed provider and patient. The visit was conducted today via webex.  HPI:   Chief Complaint: OBESITY Joan [name] is here to discuss her progress with her obesity treatment plan. She is on the Category 3 plan and is following her eating plan approximately 20 % of the time. She states she is doing cardio for 35-40 minutes 3 times per week. Joan [name] states that her weight remains the same (weight of 314 lbs). Her GI issues are improving.  We were unable to weigh the patient today for this TeleHealth visit. She feels as if she has maintained her weight since her last visit. She has lost 10 lbs since starting treatment with [name].  Vitamin D Deficiency Joan [name] has a diagnosis of vitamin D deficiency. She is currently taking prescription Vit D. Last Vit D level was 28.0. She denies nausea, vomiting or muscle weakness.  Pre-Diabetes Joan [name] has a diagnosis of pre-diabetes based on her elevated Hgb A1c and was informed this puts her at greater risk of developing diabetes. Last A1c was 5.7. She is not taking metformin currently and continues to work on diet and exercise to decrease risk of diabetes. She denies polyphagia.  ASSESSMENT AND PLAN:  Vitamin D deficiency - Plan: Vitamin D, Ergocalciferol, (DRISDOL) 1.25 MG (50000 UT) CAPS capsule  Prediabetes  Class 3 severe obesity due to excess calories without serious comorbidity in adult, unspecified BMI (HCC)  PLAN:  Vitamin D Deficiency Joan [name] was informed that low vitamin D levels contributes to fatigue and are associated with obesity, breast, and colon cancer. Joan [name] agrees to continue taking prescription Vit D 50,000 IU every week #4 and we will refill for 1 month. She will follow  up for routine testing of vitamin D, at least 2-3 times per year. She was informed of the risk of over-replacement of vitamin D and agrees to not increase her dose unless she discusses this with [name] first. [name] agrees to follow up with our clinic in 2 to 3 weeks.  Pre-Diabetes Joan [name] will continue to work on weight loss, exercise, increasing protein, and decreasing simple carbohydrates in her diet to help decrease the risk of diabetes. We dicussed metformin including benefits and risks. She was informed that eating too many simple carbohydrates or too many calories at one sitting increases the likelihood of GI side effects. Joan [name] agrees to follow up with [name] as directed to monitor her progress.  Obesity [name] is currently in the action stage of change. As such, her goal is to continue with weight loss efforts She has agreed to follow the Category 3 plan [name] has been instructed to work up to a goal of 150 minutes of combined cardio and strengthening exercise per week or continue current exercise for weight loss and overall health benefits. We discussed the following Behavioral Modification Strategies today: increasing lean protein intake, decreasing simple carbohydrates, increasing vegetables, decrease eating out, work on meal planning and easy cooking plans, emotional eating strategies, increase H20 intake, no skipping meals, keeping healthy foods in the home, and planning for success   Joan [name] has agreed to follow up with our clinic in 2 to 3 weeks. She was informed of the importance of frequent follow up visits to maximize her success with intensive lifestyle modifications  for her multiple health conditions.  ALLERGIES: No Known Allergies  MEDICATIONS: Current Outpatient Medications on File Prior to Visit  Medication Sig Dispense Refill  . dicyclomine (BENTYL) 20 MG tablet Take 20 mg by mouth as needed for spasms.    . mesalamine (PENTASA) 500 MG CR capsule Take 500 mg by mouth 2 (two)  times daily.      No current facility-administered medications on file prior to visit.     PAST MEDICAL HISTORY: Past Medical History:  Diagnosis Date  . Abnormal Pap smear   . Anemia   . Back pain   . Cancer [facility]) 2013   cervical   . Crohn's disease ([place])   . Joint pain   . Lactose intolerance   . Vitamin D deficiency     PAST SURGICAL HISTORY: Past Surgical History:  Procedure Laterality Date  . ABDOMINAL HYSTERECTOMY  2013  . CERVICAL CONE BIOPSY    . HYSTEROSCOPY    . LEEP  02/22/2012   Procedure: LOOP ELECTROSURGICAL EXCISION PROCEDURE (LEEP);  Surgeon: Thurnell Lose, MD;  Location[facility];  Service: Gynecology;  Laterality: N/A;  . MYRINGOTOMY    . TYMPANOSTOMY TUBE PLACEMENT  AGE 22  . WISDOM TOOTH EXTRACTION  ORAL SURG OFFICE--  1998    SOCIAL HISTORY: Social History   Tobacco Use  . Smoking status: Never Smoker  . Smokeless tobacco: Never Used  Substance Use Topics  . Alcohol use: No  . Drug use: No    FAMILY HISTORY: Family History  Problem Relation Age of Onset  . Obesity Mother   . Diabetes Father   . High blood pressure Father   . Kidney disease Father     ROS: Review of Systems  Constitutional: Negative for weight loss.  Gastrointestinal: Negative for nausea and vomiting.  Musculoskeletal:       Negative muscle weakness    PHYSICAL EXAM: Pt in no acute distress  RECENT LABS AND TESTS: BMET    Component Value Date/Time   NA 138 08/17/2019   K 4.3 08/17/2019   CL 101 12/28/2018 1219   CO2 25 12/28/2018 1219   GLUCOSE 86 12/28/2018 1219   BUN 11 08/17/2019   CREATININE 0.7 08/17/2019   CREATININE 0.71 12/28/2018 1219   CALCIUM 9.2 12/28/2018 1219   GFRNONAA 102 12/28/2018 1219   GFRAA 118 12/28/2018 1219   Lab Results  Component Value Date   HGBA1C 5.7 08/17/2019   HGBA1C 5.7 (H) 12/28/2018   Lab Results  Component Value Date   INSULIN 18.9 12/28/2018   CBC    Component Value Date/Time   WBC 4.8  08/17/2019   HGB 13.1 08/17/2019   HCT 40 08/17/2019   PLT 376 08/17/2019   Iron/TIBC/Ferritin/ %Sat No results found for: IRON, TIBC, FERRITIN, IRONPCTSAT Lipid Panel     Component Value Date/Time   CHOL 195 08/17/2019   TRIG 90 08/17/2019   HDL 5 (A) 08/17/2019   LDLCALC 136 08/17/2019   Hepatic Function Panel     Component Value Date/Time   PROT 7.3 12/28/2018 1219   ALBUMIN 4.0 12/28/2018 1219   AST 22 12/28/2018 1219   ALT 22 12/28/2018 1219   ALKPHOS 77 12/28/2018 1219   BILITOT 0.3 12/28/2018 1219      Component Value Date/Time   TSH 4.100 12/28/2018 1219   TSH 2.56 08/20/2014 1030      I, Trixie Dredge, am acting as Location manager for [employer], DO  I have reviewed the  above documentation for accuracy and completeness, and I agree with the above. -Jearld Lesch, DO

## 2019-11-02 ENCOUNTER — Encounter (INDEPENDENT_AMBULATORY_CARE_PROVIDER_SITE_OTHER): Payer: Self-pay | Admitting: Bariatrics

## 2019-11-21 ENCOUNTER — Other Ambulatory Visit: Payer: Self-pay | Admitting: Family Medicine

## 2019-11-21 DIAGNOSIS — Z1231 Encounter for screening mammogram for malignant neoplasm of breast: Secondary | ICD-10-CM

## 2019-11-22 ENCOUNTER — Telehealth (INDEPENDENT_AMBULATORY_CARE_PROVIDER_SITE_OTHER): Payer: BC Managed Care – PPO | Admitting: Bariatrics

## 2019-11-22 ENCOUNTER — Other Ambulatory Visit: Payer: Self-pay

## 2019-11-22 ENCOUNTER — Encounter (INDEPENDENT_AMBULATORY_CARE_PROVIDER_SITE_OTHER): Payer: Self-pay | Admitting: Bariatrics

## 2019-11-22 DIAGNOSIS — K50919 Crohn's disease, unspecified, with unspecified complications: Secondary | ICD-10-CM

## 2019-11-22 DIAGNOSIS — E559 Vitamin D deficiency, unspecified: Secondary | ICD-10-CM | POA: Diagnosis not present

## 2019-11-22 DIAGNOSIS — Z6841 Body Mass Index (BMI) 40.0 and over, adult: Secondary | ICD-10-CM | POA: Diagnosis not present

## 2019-11-22 NOTE — Progress Notes (Signed)
Office: [phone]  /  Fax: [phone] TeleHealth Visit:  Joan [name] has verbally consented to this TeleHealth visit today. The patient is located at home, the provider is located at the [employer] [facility] office. The participants in this visit include the listed provider and patient. The visit was conducted today via Webex.  HPI:   Chief Complaint: OBESITY Joan [name] is here to discuss her progress with her obesity treatment plan. She is on the Category 3 plan and is following her eating plan approximately 20% of the time. She states she is exercising on the exercise bike 40 minutes 3 times per week. Deanette states that her weight remains the same (314). Her Crohn's symptoms are better, but she is still having some issues. We were unable to weigh the patient today for this TeleHealth visit. She feels as if she has maintained her weight since her last visit. She has lost 10 lbs since starting treatment with [name].  Crohn's Disease with Complication Joan [name] has a diagnosis of Crohn's disease with complication. She is taking Pentasa and Bentyl.  Vitamin D deficiency Joan [name] has a diagnosis of Vitamin D deficiency. Last Vitamin D 28.0 on 08/17/2019. She is currently taking Vit D and denies nausea, vomiting or muscle weakness. She reports minimal sun exposure.  ASSESSMENT AND PLAN:  Crohn's disease with complication, unspecified gastrointestinal tract location [facility])  Vitamin D deficiency  Class 3 severe obesity with serious comorbidity and body mass index (BMI) of 45.0 to 49.9 in adult, unspecified obesity type ([place])  PLAN:  Crohn's Disease with Complication Joan [name] was instructed to continue her medications and avoid trigger foods. She will also add in some protein to her diet.  Vitamin D Deficiency Joan [name] was informed that low Vitamin D levels contributes to fatigue and are associated with obesity, breast, and colon cancer. She agrees to continue taking Vit D and will follow-up  for routine testing of Vitamin D, at least 2-3 times per year. She was informed of the risk of over-replacement of Vitamin D and agrees to not increase her dose unless she discusses this with [name] first. Joan [name] agrees to follow-up with our clinic in 2-3 weeks.  Obesity Joan [name] is currently in the action stage of change. As such, her goal is to continue with weight loss efforts. She has agreed to follow the Category 3 plan. Joan [name] will work on meal planning, increasing her protein, and will make sure she is getting enough water. Joan [name] has been instructed to continue and increase exercise for weight loss and overall health benefits. We discussed the following Behavioral Modification Strategies today: increasing lean protein intake, decreasing simple carbohydrates, increasing vegetables, increase H20 intake, decrease eating out, no skipping meals, work on meal planning and easy cooking plans, keeping healthy foods in the home, holiday eating strategies, celebration eating strategies, and planning for success.  Joan [name] has agreed to follow-up with our clinic in 2-3 weeks. She was informed of the importance of frequent follow-up visits to maximize her success with intensive lifestyle modifications for her multiple health conditions.  ALLERGIES: No Known Allergies  MEDICATIONS: Current Outpatient Medications on File Prior to Visit  Medication Sig Dispense Refill  . dicyclomine (BENTYL) 20 MG tablet Take 20 mg by mouth as needed for spasms.    . mesalamine (PENTASA) 500 MG CR capsule Take 500 mg by mouth 2 (two) times daily.     . Vitamin D, Ergocalciferol, (DRISDOL) 1.25 MG (50000 UT) CAPS capsule Take 1 capsule (50,000 Units total) by mouth  every 7 (seven) days. 4 capsule 0   No current facility-administered medications on file prior to visit.     PAST MEDICAL HISTORY: Past Medical History:  Diagnosis Date  . Abnormal Pap smear   . Anemia   . Back pain   . Cancer [facility]) 2013   cervical   .  Crohn's disease ([place])   . Joint pain   . Lactose intolerance   . Vitamin D deficiency     PAST SURGICAL HISTORY: Past Surgical History:  Procedure Laterality Date  . ABDOMINAL HYSTERECTOMY  2013  . CERVICAL CONE BIOPSY    . HYSTEROSCOPY    . LEEP  02/22/2012   Procedure: LOOP ELECTROSURGICAL EXCISION PROCEDURE (LEEP);  Surgeon: Thurnell Lose, MD;  Location: [facility];  Service: Gynecology;  Laterality: N/A;  . MYRINGOTOMY    . TYMPANOSTOMY TUBE PLACEMENT  AGE 78  . WISDOM TOOTH EXTRACTION  ORAL SURG OFFICE--  1998    SOCIAL HISTORY: Social History   Tobacco Use  . Smoking status: Never Smoker  . Smokeless tobacco: Never Used  Substance Use Topics  . Alcohol use: No  . Drug use: No    FAMILY HISTORY: Family History  Problem Relation Age of Onset  . Obesity Mother   . Diabetes Father   . High blood pressure Father   . Kidney disease Father    ROS: Review of Systems  Gastrointestinal: Negative for nausea and vomiting.       Positive for Crohn's disease.  Musculoskeletal:       Negative for muscle weakness.   PHYSICAL EXAM: Pt in no acute distress  RECENT LABS AND TESTS: BMET    Component Value Date/Time   NA 138 08/17/2019   K 4.3 08/17/2019   CL 101 12/28/2018 1219   CO2 25 12/28/2018 1219   GLUCOSE 86 12/28/2018 1219   BUN 11 08/17/2019   CREATININE 0.7 08/17/2019   CREATININE 0.71 12/28/2018 1219   CALCIUM 9.2 12/28/2018 1219   GFRNONAA 102 12/28/2018 1219   GFRAA 118 12/28/2018 1219   Lab Results  Component Value Date   HGBA1C 5.7 08/17/2019   HGBA1C 5.7 (H) 12/28/2018   Lab Results  Component Value Date   INSULIN 18.9 12/28/2018   CBC    Component Value Date/Time   WBC 4.8 08/17/2019   HGB 13.1 08/17/2019   HCT 40 08/17/2019   PLT 376 08/17/2019   Iron/TIBC/Ferritin/ %Sat No results found for: IRON, TIBC, FERRITIN, IRONPCTSAT Lipid Panel     Component Value Date/Time   CHOL 195 08/17/2019   TRIG 90 08/17/2019    HDL 5 (A) 08/17/2019   LDLCALC 136 08/17/2019   Hepatic Function Panel     Component Value Date/Time   PROT 7.3 12/28/2018 1219   ALBUMIN 4.0 12/28/2018 1219   AST 22 12/28/2018 1219   ALT 22 12/28/2018 1219   ALKPHOS 77 12/28/2018 1219   BILITOT 0.3 12/28/2018 1219      Component Value Date/Time   TSH 4.100 12/28/2018 1219   TSH 2.56 08/20/2014 1030   Results for Pressman, Damira R (MRN [id number]) as of 11/22/2019 14:12  Ref. Range 08/17/2019 00:00  Vitamin D, 25-Hydroxy Unknown 28.0   I, Michaelene Song, am acting as Location manager for [employer], DO   I have reviewed the above documentation for accuracy and completeness, and I agree with the above. -Jearld Lesch, DO

## 2019-12-06 ENCOUNTER — Encounter (INDEPENDENT_AMBULATORY_CARE_PROVIDER_SITE_OTHER): Payer: Self-pay | Admitting: Bariatrics

## 2019-12-06 ENCOUNTER — Telehealth (INDEPENDENT_AMBULATORY_CARE_PROVIDER_SITE_OTHER): Payer: BC Managed Care – PPO | Admitting: Bariatrics

## 2019-12-06 ENCOUNTER — Other Ambulatory Visit: Payer: Self-pay

## 2019-12-06 DIAGNOSIS — E559 Vitamin D deficiency, unspecified: Secondary | ICD-10-CM

## 2019-12-06 DIAGNOSIS — R7303 Prediabetes: Secondary | ICD-10-CM | POA: Diagnosis not present

## 2019-12-06 DIAGNOSIS — Z6841 Body Mass Index (BMI) 40.0 and over, adult: Secondary | ICD-10-CM

## 2019-12-07 NOTE — Progress Notes (Signed)
Office: [phone]  /  Fax: [phone] TeleHealth Visit:  Joan [name] has verbally consented to this TeleHealth visit today. The patient is located at home, the provider is located at the [employer] [facility] office. The participants in this visit include the listed provider and patient. The visit was conducted today via Webex.  HPI:  Chief Complaint: OBESITY Joan [name] is here to discuss her progress with her obesity treatment plan. She is on the Category 3 plan and states she is following her eating plan approximately 10-20% of the time. She states she is biking 35-45 minutes 3 days per week.  Joan [name] states her weight is down 1 lb (weight 313). Her Crohn's has improved some and has decreased her hunger. She states she has struggled with her water intake.  Today's visit was #22 Starting weight: 324 lbs Starting date: 12/28/2018  Vitamin D deficiency Joan [name] has a diagnosis of Vitamin D deficiency and is taking Vitamin D.  Prediabetes Joan [name] has a diagnosis of prediabetes. She denies polyphagia.  ASSESSMENT AND PLAN:  Prediabetes  Vitamin D deficiency  Class 3 severe obesity with serious comorbidity and body mass index (BMI) of 45.0 to 49.9 in adult, unspecified obesity type ([place])  PLAN:  Vitamin D Deficiency Joan [name] was informed that low Vitamin D levels contributes to fatigue and are associated with obesity, breast, and colon cancer. She agrees to continue Vit D and will follow-up for routine testing of Vitamin D, at least 2-3 times per year. She was informed of the risk of over-replacement of Vitamin D and agrees to not increase her dose unless she discusses this with [name] first. Joan [name] agrees to follow-up with our clinic in 3 weeks.  Pre-Diabetes Joan [name] will continue to work on weight loss, exercise, decrease  Carbohydrates, increase protein and healthy fats to help decrease the risk of diabetes.   Obesity Joan [name] is currently in the action stage of change. As such,  her goal is to continue with weight loss efforts. She has agreed to follow the Category 3 plan. Joan [name] will work on meal planning, increasing her protein, and will continue to weigh herself at home. Joan [name] has been instructed to continue her current exercise regimen for weight loss and overall health benefits. We discussed the following Behavioral Modification Strategies today: increasing lean protein intake, decreasing simple carbohydrates, increasing vegetables, increase H20 intake, decrease eating out, no skipping meals, work on meal planning and easy cooking plans, and keeping healthy foods in the home.  Joan [name] has agreed to follow-up with our clinic in 3 weeks. She was informed of the importance of frequent follow-up visits to maximize her success with intensive lifestyle modifications for her multiple health conditions.  ALLERGIES: No Known Allergies  MEDICATIONS: Current Outpatient Medications on File Prior to Visit  Medication Sig Dispense Refill  . dicyclomine (BENTYL) 20 MG tablet Take 20 mg by mouth as needed for spasms.    . mesalamine (PENTASA) 500 MG CR capsule Take 500 mg by mouth 2 (two) times daily.     . Vitamin D, Ergocalciferol, (DRISDOL) 1.25 MG (50000 UT) CAPS capsule Take 1 capsule (50,000 Units total) by mouth every 7 (seven) days. 4 capsule 0   No current facility-administered medications on file prior to visit.    PAST MEDICAL HISTORY: Past Medical History:  Diagnosis Date  . Abnormal Pap smear   . Anemia   . Back pain   . Cancer [facility]) 2013   cervical   . Crohn's disease ([place])   .  Joint pain   . Lactose intolerance   . Vitamin D deficiency     PAST SURGICAL HISTORY: Past Surgical History:  Procedure Laterality Date  . ABDOMINAL HYSTERECTOMY  2013  . CERVICAL CONE BIOPSY    . HYSTEROSCOPY    . LEEP  02/22/2012   Procedure: LOOP ELECTROSURGICAL EXCISION PROCEDURE (LEEP);  Surgeon: Thurnell Lose, MD;  Location: [facility];  Service:  Gynecology;  Laterality: N/A;  . MYRINGOTOMY    . TYMPANOSTOMY TUBE PLACEMENT  AGE 2  . WISDOM TOOTH EXTRACTION  ORAL SURG OFFICE--  1998    SOCIAL HISTORY: Social History   Tobacco Use  . Smoking status: Never Smoker  . Smokeless tobacco: Never Used  Substance Use Topics  . Alcohol use: No  . Drug use: No    FAMILY HISTORY: Family History  Problem Relation Age of Onset  . Obesity Mother   . Diabetes Father   . High blood pressure Father   . Kidney disease Father    ROS: Review of Systems  Endo/Heme/Allergies:       Negative for polyphagia.   PHYSICAL EXAM: Last menstrual period 08/21/2012. There is no height or weight on file to calculate BMI. Physical Exam: Pt in no acute distress.  RECENT LABS AND TESTS: BMET    Component Value Date/Time   NA 138 08/17/2019 0000   K 4.3 08/17/2019 0000   CL 101 12/28/2018 1219   CO2 25 12/28/2018 1219   GLUCOSE 86 12/28/2018 1219   BUN 11 08/17/2019 0000   CREATININE 0.7 08/17/2019 0000   CREATININE 0.71 12/28/2018 1219   CALCIUM 9.2 12/28/2018 1219   GFRNONAA 102 12/28/2018 1219   GFRAA 118 12/28/2018 1219   Lab Results  Component Value Date   HGBA1C 5.7 08/17/2019   HGBA1C 5.7 (H) 12/28/2018   Lab Results  Component Value Date   INSULIN 18.9 12/28/2018   CBC    Component Value Date/Time   WBC 4.8 08/17/2019 0000   HGB 13.1 08/17/2019 0000   HCT 40 08/17/2019 0000   PLT 376 08/17/2019 0000   Iron/TIBC/Ferritin/ %Sat No results found for: IRON, TIBC, FERRITIN, IRONPCTSAT Lipid Panel     Component Value Date/Time   CHOL 195 08/17/2019 0000   TRIG 90 08/17/2019 0000   HDL 5 (A) 08/17/2019 0000   LDLCALC 136 08/17/2019 0000   Hepatic Function Panel     Component Value Date/Time   PROT 7.3 12/28/2018 1219   ALBUMIN 4.0 12/28/2018 1219   AST 22 12/28/2018 1219   ALT 22 12/28/2018 1219   ALKPHOS 77 12/28/2018 1219   BILITOT 0.3 12/28/2018 1219      Component Value Date/Time   TSH 4.100 12/28/2018  1219   TSH 2.56 08/20/2014 1030    OBESITY BEHAVIORAL INTERVENTION VISIT DOCUMENTATION FOR INSURANCE (~15 minutes)  I, Michaelene Song, am acting as Location manager for [employer], DO  I have reviewed the above documentation for accuracy and completeness, and I agree with the above. Jearld Lesch, DO

## 2019-12-28 ENCOUNTER — Other Ambulatory Visit: Payer: Self-pay

## 2019-12-28 ENCOUNTER — Telehealth (INDEPENDENT_AMBULATORY_CARE_PROVIDER_SITE_OTHER): Payer: BC Managed Care – PPO | Admitting: Bariatrics

## 2019-12-28 DIAGNOSIS — R7303 Prediabetes: Secondary | ICD-10-CM

## 2019-12-28 DIAGNOSIS — E559 Vitamin D deficiency, unspecified: Secondary | ICD-10-CM | POA: Diagnosis not present

## 2019-12-28 DIAGNOSIS — Z6841 Body Mass Index (BMI) 40.0 and over, adult: Secondary | ICD-10-CM | POA: Diagnosis not present

## 2020-01-03 NOTE — Progress Notes (Signed)
TeleHealth Visit:  Due to the COVID-19 pandemic, this visit was completed with telemedicine (audio/video) technology to reduce patient and provider exposure as well as to preserve personal protective equipment.   Joan [name] has verbally consented to this TeleHealth visit. The patient is located at home, the provider is located at the [facility] office. The participants in this visit include the listed provider and patient. The visit was conducted today via Webex ( 20 minutes ).   Chief Complaint: Joan [name] is here to discuss her progress with her obesity treatment plan along with follow-up of her obesity related diagnoses. Joan [name] is on the Category 3 Plan and states she is following her eating plan approximately 0% of the time. Joan [name] states she is biking 45 minutes 2-3 times per week.  Today's visit was #: 23 Starting weight: 324 lbs Starting date: 12/28/2018  Interim History: Joan [name] states that her weight remains the same (313). She has been skipping meals.  Subjective:   Prediabetes. Joan [name] has a diagnosis of prediabetes based on her elevated HgA1c and was informed this puts her at greater risk of developing diabetes. She continues to work on diet and exercise to decrease her risk of diabetes. She denies nausea or hypoglycemia. Last A1c 5.7 on 08/17/2019 with an insulin of 18.9 on 12/28/2018.  Vitamin D deficiency. No nausea, vomiting, or muscle weakness. Last Vitamin D 28.0 on 08/17/2019.  Assessment/Plan:   Prediabetes. Joan [name] will continue to work on weight loss, exercise, increasing protein and healthy fats, and decreasing simple carbohydrates to help decrease the risk of diabetes.   Vitamin D deficiency. Low Vitamin D level contributes to fatigue and are associated with obesity, breast, and colon cancer. She agrees to continue taking Vitamin D and will follow-up for routine testing of vitamin D, at least 2-3 times per year to avoid  over-replacement.  Class 3 severe obesity with serious comorbidity and body mass index (BMI) of 45.0 to 49.9 in adult, unspecified obesity type ([place]).  Lilu is currently in the action stage of change. As such, her goal is to continue with weight loss efforts. She has agreed to the Category 3 Plan.   She will work on meal planning, intentional eating, will weigh herself at home, and will decrease meal skipping.  We discussed the following exercise goals today: Joan [name] will start using her exercise bike.  We discussed the following behavioral modification strategies today: increasing lean protein intake, decreasing simple carbohydrates, increasing vegetables, increasing water intake, decreasing eating out, no skipping meals, meal planning and cooking strategies and keeping healthy foods in the home.  Joan [name] has agreed to follow-up with our clinic in 2 weeks. She was informed of the importance of frequent follow-up visits to maximize her success with intensive lifestyle modifications for her multiple health conditions.  Objective:   VITALS: Per patient if applicable, see vitals. GENERAL: Alert and in no acute distress. CARDIOPULMONARY: No increased WOB. Speaking in clear sentences.  PSYCH: Pleasant and cooperative. Speech normal rate and rhythm. Affect is appropriate. Insight and judgement are appropriate. Attention is focused, linear, and appropriate.  NEURO: Oriented as arrived to appointment on time with no prompting.   Lab Results  Component Value Date   CREATININE 0.7 08/17/2019   BUN 11 08/17/2019   NA 138 08/17/2019   K 4.3 08/17/2019   CL 101 12/28/2018   CO2 25 12/28/2018   Lab Results  Component Value Date   ALT 22 12/28/2018  AST 22 12/28/2018   ALKPHOS 77 12/28/2018   BILITOT 0.3 12/28/2018   Lab Results  Component Value Date   HGBA1C 5.7 08/17/2019   HGBA1C 5.7 (H) 12/28/2018   Lab Results  Component Value Date   INSULIN 18.9 12/28/2018   Lab  Results  Component Value Date   TSH 4.100 12/28/2018   Lab Results  Component Value Date   CHOL 195 08/17/2019   HDL 5 (A) 08/17/2019   LDLCALC 136 08/17/2019   TRIG 90 08/17/2019   Lab Results  Component Value Date   WBC 4.8 08/17/2019   HGB 13.1 08/17/2019   HCT 40 08/17/2019   PLT 376 08/17/2019   No results found for: IRON, TIBC, FERRITIN  Attestation Statements:   Reviewed by clinician on day of visit: allergies, medications, problem list, medical history, surgical history, family history, social history, and previous encounter notes.  Migdalia Dk, am acting as Location manager for [employer], DO   I have reviewed the above documentation for accuracy and completeness, and I agree with the above. Jearld Lesch, DO

## 2020-01-09 ENCOUNTER — Encounter (INDEPENDENT_AMBULATORY_CARE_PROVIDER_SITE_OTHER): Payer: Self-pay

## 2020-01-11 ENCOUNTER — Encounter (INDEPENDENT_AMBULATORY_CARE_PROVIDER_SITE_OTHER): Payer: Self-pay | Admitting: Bariatrics

## 2020-01-11 ENCOUNTER — Telehealth (INDEPENDENT_AMBULATORY_CARE_PROVIDER_SITE_OTHER): Payer: BC Managed Care – PPO | Admitting: Bariatrics

## 2020-01-11 ENCOUNTER — Other Ambulatory Visit: Payer: Self-pay

## 2020-01-11 DIAGNOSIS — E559 Vitamin D deficiency, unspecified: Secondary | ICD-10-CM

## 2020-01-11 DIAGNOSIS — K50919 Crohn's disease, unspecified, with unspecified complications: Secondary | ICD-10-CM

## 2020-01-11 DIAGNOSIS — Z6841 Body Mass Index (BMI) 40.0 and over, adult: Secondary | ICD-10-CM | POA: Diagnosis not present

## 2020-01-11 MED ORDER — VITAMIN D (ERGOCALCIFEROL) 1.25 MG (50000 UNIT) PO CAPS: 50000.0000 [IU] | ORAL_CAPSULE | ORAL | 0 refills | Status: AC

## 2020-01-12 ENCOUNTER — Ambulatory Visit
Admission: RE | Admit: 2020-01-12 | Discharge: 2020-01-12 | Disposition: A | Discharge status: Home / Self Care | Payer: BC Managed Care – PPO | Source: Ambulatory Visit | Attending: Family Medicine | Admitting: Family Medicine

## 2020-01-12 ENCOUNTER — Other Ambulatory Visit: Payer: Self-pay

## 2020-01-12 DIAGNOSIS — Z1231 Encounter for screening mammogram for malignant neoplasm of breast: Secondary | ICD-10-CM | POA: Diagnosis not present

## 2020-01-15 DIAGNOSIS — K50119 Crohn's disease of large intestine with unspecified complications: Secondary | ICD-10-CM | POA: Diagnosis not present

## 2020-01-15 NOTE — Progress Notes (Signed)
TeleHealth Visit:  Due to the COVID-19 pandemic, this visit was completed with telemedicine (audio/video) technology to reduce patient and provider exposure as well as to preserve personal protective equipment.   Joan [name] has verbally consented to this TeleHealth visit. The patient is located at home, the provider is located at the [facility] office. The participants in this visit include the listed provider and patient. The visit was conducted today via webex.   Chief Complaint: OBESITY Joan [name] is here to discuss her progress with her obesity treatment plan along with follow-up of her obesity related diagnoses. Joan [name] is on the Category 3 Plan and states she is following her eating plan approximately 30% of the time. Joan [name] states she is bike riding for 30-45 minutes 2-3 times per week.  Today's visit was #: 24 Starting weight: 324 lbs Starting date: 12/28/2018  Interim History: Joan [name] states that she is up 2 lbs (weight of 315 lbs). She states she has been hanging in. She  Is eating more protein.  Subjective:   1. Vitamin D deficiency Joan [name] is taking Vit D, and denies nausea, vomiting, or muscle weakness.  2. Crohn's disease with complication, unspecified gastrointestinal tract location Joan [name]) Joan [name] is taking Pentasa and Bentyl.  Assessment/Plan:   1. Vitamin D deficiency Low Vitamin D level contributes to fatigue and are associated with obesity, breast, and colon cancer. We will refill prescription Vit D for 1 month. Joan [name] will follow-up for routine testing of Vitamin D, at least 2-3 times per year to avoid over-replacement. We will continue to monitor.  - Vitamin D, Ergocalciferol, (DRISDOL) 1.25 MG (50000 UNIT) CAPS capsule; Take 1 capsule (50,000 Units total) by mouth every 7 (seven) days.  Dispense: 4 capsule; Refill: 0  2. Crohn's disease with complication, unspecified gastrointestinal tract location Joan [name]) Joan [name] agreed to continue her medications. She is to  avoid offsetting foods, and ease back into the plan.  3. Class 3 severe obesity with serious comorbidity and body mass index (BMI) of 45.0 to 49.9 in adult, unspecified obesity type Joan [name]) Joan [name] is currently in the action stage of change. As such, her goal is to continue with weight loss efforts. She has agreed to the Category 3 Plan.   Joan [name] will work on meal planning, and be more adhering to the plan as she is able to tolerate.  Exercise goals: Joan [name] is to continue her current exercise.  Behavioral modification strategies: increasing lean protein intake, decreasing simple carbohydrates, increasing vegetables, increasing water intake, decreasing eating out, no skipping meals, meal planning and cooking strategies, keeping healthy foods in the home and planning for success.  Joan [name] has agreed to follow-up with our clinic in 2 weeks. She was informed of the importance of frequent follow-up visits to maximize her success with intensive lifestyle modifications for her multiple health conditions.  Objective:   VITALS: Per patient if applicable, see vitals. GENERAL: Alert and in no acute distress. CARDIOPULMONARY: No increased WOB. Speaking in clear sentences.  PSYCH: Pleasant and cooperative. Speech normal rate and rhythm. Affect is appropriate. Insight and judgement are appropriate. Attention is focused, linear, and appropriate.  NEURO: Oriented as arrived to appointment on time with no prompting.   Lab Results  Component Value Date   CREATININE 0.7 08/17/2019   BUN 11 08/17/2019   NA 138 08/17/2019   K 4.3 08/17/2019   CL 101 12/28/2018   CO2 25 12/28/2018   Lab Results  Component Value Date   ALT 22 12/28/2018   AST  22 12/28/2018   ALKPHOS 77 12/28/2018   BILITOT 0.3 12/28/2018   Lab Results  Component Value Date   HGBA1C 5.7 08/17/2019   HGBA1C 5.7 (H) 12/28/2018   Lab Results  Component Value Date   INSULIN 18.9 12/28/2018   Lab Results  Component Value Date   TSH  4.100 12/28/2018   Lab Results  Component Value Date   CHOL 195 08/17/2019   HDL 5 (A) 08/17/2019   LDLCALC 136 08/17/2019   TRIG 90 08/17/2019   Lab Results  Component Value Date   WBC 4.8 08/17/2019   HGB 13.1 08/17/2019   HCT 40 08/17/2019   PLT 376 08/17/2019    Attestation Statements:   Reviewed by clinician on day of visit: allergies, medications, problem list, medical history, surgical history, family history, social history, and previous encounter notes.   Wilhemena Durie, am acting as Location manager for [employer], DO.  I have reviewed the above documentation for accuracy and completeness, and I agree with the above. Jearld Lesch, DO

## 2020-01-30 ENCOUNTER — Encounter (INDEPENDENT_AMBULATORY_CARE_PROVIDER_SITE_OTHER): Payer: Self-pay | Admitting: Bariatrics

## 2020-01-30 ENCOUNTER — Telehealth (INDEPENDENT_AMBULATORY_CARE_PROVIDER_SITE_OTHER): Payer: BC Managed Care – PPO | Admitting: Bariatrics

## 2020-01-30 ENCOUNTER — Other Ambulatory Visit: Payer: Self-pay

## 2020-01-30 DIAGNOSIS — E559 Vitamin D deficiency, unspecified: Secondary | ICD-10-CM

## 2020-01-30 DIAGNOSIS — R7303 Prediabetes: Secondary | ICD-10-CM

## 2020-01-30 DIAGNOSIS — Z6841 Body Mass Index (BMI) 40.0 and over, adult: Secondary | ICD-10-CM

## 2020-01-30 NOTE — Progress Notes (Signed)
TeleHealth Visit:  Due to the COVID-19 pandemic, this visit was completed with telemedicine (audio/video) technology to reduce patient and provider exposure as well as to preserve personal protective equipment.   Joan [name] has verbally consented to this TeleHealth visit. The patient is located at home, the provider is located at the [facility] office. The participants in this visit include the listed provider and patient. The visit was conducted today via Webex.  Chief Complaint: OBESITY [place] is here to discuss her progress with her obesity treatment plan along with follow-up of her obesity related diagnoses. Joan [name] is on the Category 3 Plan and states she is following her eating plan approximately 15% of the time. Joan [name] states she is biking 35-45 minutes 1 time per week.  Today's visit was #: 25 Starting weight: 324 lbs Starting date: 12/28/2018  Interim History: Joan [name] is down 1 lb (weight 314). She states that her dad is in ICU.  Subjective:   Prediabetes. Joan [name] has a diagnosis of prediabetes based on her elevated HgA1c and was informed this puts her at greater risk of developing diabetes. She continues to work on diet and exercise to decrease her risk of diabetes. She denies nausea or hypoglycemia.  Lab Results  Component Value Date   HGBA1C 5.7 08/17/2019   Lab Results  Component Value Date   INSULIN 18.9 12/28/2018   Vitamin D deficiency. Joan [name] is taking Vitamin D. Last Vitamin D level 28.0 on 08/17/2019.  Assessment/Plan:   Prediabetes. Joan [name] will continue to work on weight loss, exercise, increasing healthy fats and protein,and decreasing simple carbohydrates to help decrease the risk of diabetes. She will remain active.  Vitamin D deficiency. Low Vitamin D level contributes to fatigue and are associated with obesity, breast, and colon cancer. She agrees to continue to take Vitamin D and will follow-up for routine testing of Vitamin D, at  least 2-3 times per year to avoid over-replacement.  Class 3 severe obesity with serious comorbidity and body mass index (BMI) of 45.0 to 49.9 in adult, unspecified obesity type (Joan [name]).  Joan [name] is currently in the action stage of change. As such, her goal is to continue with weight loss efforts. She has agreed to the Category 3 Plan.   She will work on intentional eating, meal planning, and will weigh herself at home.  Exercise goals: All adults should avoid inactivity. Some physical activity is better than none, and adults who participate in any amount of physical activity gain some health benefits.  Behavioral modification strategies: increasing lean protein intake, decreasing simple carbohydrates, increasing vegetables, increasing water intake, decreasing eating out, no skipping meals, meal planning and cooking strategies, keeping healthy foods in the home and planning for success.  Joan [name] has agreed to follow-up with our clinic in 2 weeks. She was informed of the importance of frequent follow-up visits to maximize her success with intensive lifestyle modifications for her multiple health conditions.  Objective:   VITALS: Per patient if applicable, see vitals. GENERAL: Alert and in no acute distress. CARDIOPULMONARY: No increased WOB. Speaking in clear sentences.  PSYCH: Pleasant and cooperative. Speech normal rate and rhythm. Affect is appropriate. Insight and judgement are appropriate. Attention is focused, linear, and appropriate.  NEURO: Oriented as arrived to appointment on time with no prompting.   Lab Results  Component Value Date   CREATININE 0.7 08/17/2019   BUN 11 08/17/2019   NA 138 08/17/2019   K 4.3 08/17/2019   CL 101 12/28/2018  CO2 25 12/28/2018   Lab Results  Component Value Date   ALT 22 12/28/2018   AST 22 12/28/2018   ALKPHOS 77 12/28/2018   BILITOT 0.3 12/28/2018   Lab Results  Component Value Date   HGBA1C 5.7 08/17/2019   HGBA1C 5.7 (H) 12/28/2018    Lab Results  Component Value Date   INSULIN 18.9 12/28/2018   Lab Results  Component Value Date   TSH 4.100 12/28/2018   Lab Results  Component Value Date   CHOL 195 08/17/2019   HDL 5 (A) 08/17/2019   LDLCALC 136 08/17/2019   TRIG 90 08/17/2019   Lab Results  Component Value Date   WBC 4.8 08/17/2019   HGB 13.1 08/17/2019   HCT 40 08/17/2019   PLT 376 08/17/2019   No results found for: IRON, TIBC, FERRITIN  Attestation Statements:   Reviewed by clinician on day of visit: allergies, medications, problem list, medical history, surgical history, family history, social history, and previous encounter notes.  Time spent on visit including pre-visit chart review and post-visit care was 20 minutes.   Migdalia Dk, am acting as Location manager for [employer], DO   I have reviewed the above documentation for accuracy and completeness, and I agree with the above. Jearld Lesch, DO

## 2020-03-26 DIAGNOSIS — K50119 Crohn's disease of large intestine with unspecified complications: Secondary | ICD-10-CM | POA: Diagnosis not present

## 2020-04-11 ENCOUNTER — Ambulatory Visit: Payer: BC Managed Care – PPO | Attending: Internal Medicine

## 2020-04-11 DIAGNOSIS — Z03818 Encounter for observation for suspected exposure to other biological agents ruled out: Secondary | ICD-10-CM | POA: Diagnosis not present

## 2020-04-11 DIAGNOSIS — Z20828 Contact with and (suspected) exposure to other viral communicable diseases: Secondary | ICD-10-CM | POA: Diagnosis not present

## 2020-04-11 DIAGNOSIS — Z23 Encounter for immunization: Secondary | ICD-10-CM

## 2020-04-11 NOTE — Progress Notes (Signed)
   Covid-19 Vaccination Clinic  Name:  Joan [name]    MRN: [id number] DOB: December 25, 1970  04/11/2020  Joan [name] was observed post Covid-19 immunization for 15 minutes without incident. She was provided with Vaccine Information Sheet and instruction to access the V-Safe system.   Joan [name] was instructed to call 911 with any severe reactions post vaccine: Marland Kitchen Difficulty breathing  . Swelling of face and throat  . A fast heartbeat  . A bad rash all over body  . Dizziness and weakness   Immunizations Administered    Name Date Dose VIS Date Route   Pfizer COVID-19 Vaccine 04/11/2020  3:35 PM 0.3 mL 02/14/2019 Intramuscular   Manufacturer: [employer]   Lot: [id number]   [place]: [id number]

## 2020-05-06 ENCOUNTER — Ambulatory Visit: Payer: BC Managed Care – PPO | Attending: Internal Medicine

## 2020-05-06 DIAGNOSIS — Z23 Encounter for immunization: Secondary | ICD-10-CM

## 2020-05-06 NOTE — Progress Notes (Signed)
   Covid-19 Vaccination Clinic  Name:  DEEPTI GUNAWAN    MRN: [id number] DOB: Apr 26, 1971  05/06/2020  Ms. Lema was observed post Covid-19 immunization for 15 minutes without incident. She was provided with Vaccine Information Sheet and instruction to access the V-Safe system.   Ms. Laningham was instructed to call 911 with any severe reactions post vaccine: Marland Kitchen Difficulty breathing  . Swelling of face and throat  . A fast heartbeat  . A bad rash all over body  . Dizziness and weakness   Immunizations Administered    Name Date Dose VIS Date Route   Pfizer COVID-19 Vaccine 05/06/2020  3:12 PM 0.3 mL 02/14/2019 Intramuscular   Manufacturer: [place]   Lot: [id number]   [place]: [id number]

## 2020-05-29 DIAGNOSIS — M9906 Segmental and somatic dysfunction of lower extremity: Secondary | ICD-10-CM | POA: Diagnosis not present

## 2020-05-29 DIAGNOSIS — M9903 Segmental and somatic dysfunction of lumbar region: Secondary | ICD-10-CM | POA: Diagnosis not present

## 2020-05-29 DIAGNOSIS — M542 Cervicalgia: Secondary | ICD-10-CM | POA: Diagnosis not present

## 2020-05-29 DIAGNOSIS — M9902 Segmental and somatic dysfunction of thoracic region: Secondary | ICD-10-CM | POA: Diagnosis not present

## 2020-05-30 DIAGNOSIS — M542 Cervicalgia: Secondary | ICD-10-CM | POA: Diagnosis not present

## 2020-05-30 DIAGNOSIS — M9903 Segmental and somatic dysfunction of lumbar region: Secondary | ICD-10-CM | POA: Diagnosis not present

## 2020-05-30 DIAGNOSIS — M9906 Segmental and somatic dysfunction of lower extremity: Secondary | ICD-10-CM | POA: Diagnosis not present

## 2020-05-30 DIAGNOSIS — M9902 Segmental and somatic dysfunction of thoracic region: Secondary | ICD-10-CM | POA: Diagnosis not present

## 2020-05-31 DIAGNOSIS — M9903 Segmental and somatic dysfunction of lumbar region: Secondary | ICD-10-CM | POA: Diagnosis not present

## 2020-05-31 DIAGNOSIS — M9906 Segmental and somatic dysfunction of lower extremity: Secondary | ICD-10-CM | POA: Diagnosis not present

## 2020-05-31 DIAGNOSIS — M542 Cervicalgia: Secondary | ICD-10-CM | POA: Diagnosis not present

## 2020-05-31 DIAGNOSIS — M9902 Segmental and somatic dysfunction of thoracic region: Secondary | ICD-10-CM | POA: Diagnosis not present

## 2020-06-03 DIAGNOSIS — M542 Cervicalgia: Secondary | ICD-10-CM | POA: Diagnosis not present

## 2020-06-03 DIAGNOSIS — M9902 Segmental and somatic dysfunction of thoracic region: Secondary | ICD-10-CM | POA: Diagnosis not present

## 2020-06-03 DIAGNOSIS — M9906 Segmental and somatic dysfunction of lower extremity: Secondary | ICD-10-CM | POA: Diagnosis not present

## 2020-06-03 DIAGNOSIS — M9903 Segmental and somatic dysfunction of lumbar region: Secondary | ICD-10-CM | POA: Diagnosis not present

## 2020-06-05 DIAGNOSIS — M9902 Segmental and somatic dysfunction of thoracic region: Secondary | ICD-10-CM | POA: Diagnosis not present

## 2020-06-05 DIAGNOSIS — M542 Cervicalgia: Secondary | ICD-10-CM | POA: Diagnosis not present

## 2020-06-05 DIAGNOSIS — M9906 Segmental and somatic dysfunction of lower extremity: Secondary | ICD-10-CM | POA: Diagnosis not present

## 2020-06-05 DIAGNOSIS — M9903 Segmental and somatic dysfunction of lumbar region: Secondary | ICD-10-CM | POA: Diagnosis not present

## 2020-06-07 DIAGNOSIS — M9901 Segmental and somatic dysfunction of cervical region: Secondary | ICD-10-CM | POA: Diagnosis not present

## 2020-06-07 DIAGNOSIS — M9903 Segmental and somatic dysfunction of lumbar region: Secondary | ICD-10-CM | POA: Diagnosis not present

## 2020-06-07 DIAGNOSIS — M9906 Segmental and somatic dysfunction of lower extremity: Secondary | ICD-10-CM | POA: Diagnosis not present

## 2020-06-07 DIAGNOSIS — M542 Cervicalgia: Secondary | ICD-10-CM | POA: Diagnosis not present

## 2020-06-07 DIAGNOSIS — M9902 Segmental and somatic dysfunction of thoracic region: Secondary | ICD-10-CM | POA: Diagnosis not present

## 2020-06-12 DIAGNOSIS — M9906 Segmental and somatic dysfunction of lower extremity: Secondary | ICD-10-CM | POA: Diagnosis not present

## 2020-06-12 DIAGNOSIS — M542 Cervicalgia: Secondary | ICD-10-CM | POA: Diagnosis not present

## 2020-06-12 DIAGNOSIS — M9903 Segmental and somatic dysfunction of lumbar region: Secondary | ICD-10-CM | POA: Diagnosis not present

## 2020-06-12 DIAGNOSIS — M9902 Segmental and somatic dysfunction of thoracic region: Secondary | ICD-10-CM | POA: Diagnosis not present

## 2020-06-14 DIAGNOSIS — M9903 Segmental and somatic dysfunction of lumbar region: Secondary | ICD-10-CM | POA: Diagnosis not present

## 2020-06-14 DIAGNOSIS — M9902 Segmental and somatic dysfunction of thoracic region: Secondary | ICD-10-CM | POA: Diagnosis not present

## 2020-06-14 DIAGNOSIS — M9906 Segmental and somatic dysfunction of lower extremity: Secondary | ICD-10-CM | POA: Diagnosis not present

## 2020-06-14 DIAGNOSIS — M542 Cervicalgia: Secondary | ICD-10-CM | POA: Diagnosis not present

## 2020-06-17 DIAGNOSIS — M9903 Segmental and somatic dysfunction of lumbar region: Secondary | ICD-10-CM | POA: Diagnosis not present

## 2020-06-17 DIAGNOSIS — M9906 Segmental and somatic dysfunction of lower extremity: Secondary | ICD-10-CM | POA: Diagnosis not present

## 2020-06-17 DIAGNOSIS — M9902 Segmental and somatic dysfunction of thoracic region: Secondary | ICD-10-CM | POA: Diagnosis not present

## 2020-06-17 DIAGNOSIS — M542 Cervicalgia: Secondary | ICD-10-CM | POA: Diagnosis not present

## 2020-06-18 DIAGNOSIS — M9903 Segmental and somatic dysfunction of lumbar region: Secondary | ICD-10-CM | POA: Diagnosis not present

## 2020-06-18 DIAGNOSIS — M9902 Segmental and somatic dysfunction of thoracic region: Secondary | ICD-10-CM | POA: Diagnosis not present

## 2020-06-18 DIAGNOSIS — M9906 Segmental and somatic dysfunction of lower extremity: Secondary | ICD-10-CM | POA: Diagnosis not present

## 2020-06-18 DIAGNOSIS — M542 Cervicalgia: Secondary | ICD-10-CM | POA: Diagnosis not present

## 2020-06-20 DIAGNOSIS — M9906 Segmental and somatic dysfunction of lower extremity: Secondary | ICD-10-CM | POA: Diagnosis not present

## 2020-06-20 DIAGNOSIS — M9903 Segmental and somatic dysfunction of lumbar region: Secondary | ICD-10-CM | POA: Diagnosis not present

## 2020-06-20 DIAGNOSIS — M542 Cervicalgia: Secondary | ICD-10-CM | POA: Diagnosis not present

## 2020-06-20 DIAGNOSIS — M9902 Segmental and somatic dysfunction of thoracic region: Secondary | ICD-10-CM | POA: Diagnosis not present

## 2020-06-27 DIAGNOSIS — M9901 Segmental and somatic dysfunction of cervical region: Secondary | ICD-10-CM | POA: Diagnosis not present

## 2020-06-27 DIAGNOSIS — M9903 Segmental and somatic dysfunction of lumbar region: Secondary | ICD-10-CM | POA: Diagnosis not present

## 2020-06-27 DIAGNOSIS — M9902 Segmental and somatic dysfunction of thoracic region: Secondary | ICD-10-CM | POA: Diagnosis not present

## 2020-06-27 DIAGNOSIS — M9906 Segmental and somatic dysfunction of lower extremity: Secondary | ICD-10-CM | POA: Diagnosis not present

## 2020-07-01 DIAGNOSIS — M9901 Segmental and somatic dysfunction of cervical region: Secondary | ICD-10-CM | POA: Diagnosis not present

## 2020-07-01 DIAGNOSIS — M9902 Segmental and somatic dysfunction of thoracic region: Secondary | ICD-10-CM | POA: Diagnosis not present

## 2020-07-01 DIAGNOSIS — M9906 Segmental and somatic dysfunction of lower extremity: Secondary | ICD-10-CM | POA: Diagnosis not present

## 2020-07-01 DIAGNOSIS — M9903 Segmental and somatic dysfunction of lumbar region: Secondary | ICD-10-CM | POA: Diagnosis not present

## 2020-07-03 DIAGNOSIS — M9903 Segmental and somatic dysfunction of lumbar region: Secondary | ICD-10-CM | POA: Diagnosis not present

## 2020-07-03 DIAGNOSIS — M9901 Segmental and somatic dysfunction of cervical region: Secondary | ICD-10-CM | POA: Diagnosis not present

## 2020-07-03 DIAGNOSIS — M9906 Segmental and somatic dysfunction of lower extremity: Secondary | ICD-10-CM | POA: Diagnosis not present

## 2020-07-03 DIAGNOSIS — M9902 Segmental and somatic dysfunction of thoracic region: Secondary | ICD-10-CM | POA: Diagnosis not present

## 2020-07-04 DIAGNOSIS — M9902 Segmental and somatic dysfunction of thoracic region: Secondary | ICD-10-CM | POA: Diagnosis not present

## 2020-07-04 DIAGNOSIS — M9906 Segmental and somatic dysfunction of lower extremity: Secondary | ICD-10-CM | POA: Diagnosis not present

## 2020-07-04 DIAGNOSIS — M9901 Segmental and somatic dysfunction of cervical region: Secondary | ICD-10-CM | POA: Diagnosis not present

## 2020-07-04 DIAGNOSIS — M9903 Segmental and somatic dysfunction of lumbar region: Secondary | ICD-10-CM | POA: Diagnosis not present

## 2020-07-12 DIAGNOSIS — M9903 Segmental and somatic dysfunction of lumbar region: Secondary | ICD-10-CM | POA: Diagnosis not present

## 2020-07-12 DIAGNOSIS — M9902 Segmental and somatic dysfunction of thoracic region: Secondary | ICD-10-CM | POA: Diagnosis not present

## 2020-07-12 DIAGNOSIS — M9901 Segmental and somatic dysfunction of cervical region: Secondary | ICD-10-CM | POA: Diagnosis not present

## 2020-07-12 DIAGNOSIS — M9906 Segmental and somatic dysfunction of lower extremity: Secondary | ICD-10-CM | POA: Diagnosis not present

## 2020-07-16 DIAGNOSIS — M9902 Segmental and somatic dysfunction of thoracic region: Secondary | ICD-10-CM | POA: Diagnosis not present

## 2020-07-16 DIAGNOSIS — M9906 Segmental and somatic dysfunction of lower extremity: Secondary | ICD-10-CM | POA: Diagnosis not present

## 2020-07-16 DIAGNOSIS — M25561 Pain in right knee: Secondary | ICD-10-CM | POA: Diagnosis not present

## 2020-07-16 DIAGNOSIS — M9901 Segmental and somatic dysfunction of cervical region: Secondary | ICD-10-CM | POA: Diagnosis not present

## 2020-07-16 DIAGNOSIS — M25562 Pain in left knee: Secondary | ICD-10-CM | POA: Diagnosis not present

## 2020-07-17 DIAGNOSIS — M9903 Segmental and somatic dysfunction of lumbar region: Secondary | ICD-10-CM | POA: Diagnosis not present

## 2020-07-17 DIAGNOSIS — M9906 Segmental and somatic dysfunction of lower extremity: Secondary | ICD-10-CM | POA: Diagnosis not present

## 2020-07-17 DIAGNOSIS — M9902 Segmental and somatic dysfunction of thoracic region: Secondary | ICD-10-CM | POA: Diagnosis not present

## 2020-07-17 DIAGNOSIS — M9901 Segmental and somatic dysfunction of cervical region: Secondary | ICD-10-CM | POA: Diagnosis not present

## 2020-07-19 DIAGNOSIS — M9906 Segmental and somatic dysfunction of lower extremity: Secondary | ICD-10-CM | POA: Diagnosis not present

## 2020-07-19 DIAGNOSIS — M9901 Segmental and somatic dysfunction of cervical region: Secondary | ICD-10-CM | POA: Diagnosis not present

## 2020-07-19 DIAGNOSIS — M9902 Segmental and somatic dysfunction of thoracic region: Secondary | ICD-10-CM | POA: Diagnosis not present

## 2020-07-19 DIAGNOSIS — M9903 Segmental and somatic dysfunction of lumbar region: Secondary | ICD-10-CM | POA: Diagnosis not present

## 2020-09-24 DIAGNOSIS — Z0000 Encounter for general adult medical examination without abnormal findings: Secondary | ICD-10-CM | POA: Diagnosis not present

## 2020-09-24 DIAGNOSIS — E559 Vitamin D deficiency, unspecified: Secondary | ICD-10-CM | POA: Diagnosis not present

## 2020-09-24 DIAGNOSIS — K5010 Crohn's disease of large intestine without complications: Secondary | ICD-10-CM | POA: Diagnosis not present

## 2020-09-24 DIAGNOSIS — Z13220 Encounter for screening for lipoid disorders: Secondary | ICD-10-CM | POA: Diagnosis not present

## 2021-03-06 ENCOUNTER — Other Ambulatory Visit: Payer: Self-pay | Admitting: Family Medicine

## 2021-03-06 DIAGNOSIS — Z1231 Encounter for screening mammogram for malignant neoplasm of breast: Secondary | ICD-10-CM

## 2021-04-25 DIAGNOSIS — E7800 Pure hypercholesterolemia, unspecified: Secondary | ICD-10-CM | POA: Diagnosis not present

## 2021-04-25 DIAGNOSIS — E559 Vitamin D deficiency, unspecified: Secondary | ICD-10-CM | POA: Diagnosis not present

## 2021-04-29 ENCOUNTER — Other Ambulatory Visit: Payer: Self-pay

## 2021-04-29 ENCOUNTER — Ambulatory Visit
Admission: RE | Admit: 2021-04-29 | Discharge: 2021-04-29 | Disposition: A | Discharge status: Home / Self Care | Payer: BC Managed Care – PPO | Source: Ambulatory Visit | Attending: Family Medicine | Admitting: Family Medicine

## 2021-04-29 DIAGNOSIS — Z1231 Encounter for screening mammogram for malignant neoplasm of breast: Secondary | ICD-10-CM | POA: Diagnosis not present

## 2021-05-12 DIAGNOSIS — R1084 Generalized abdominal pain: Secondary | ICD-10-CM | POA: Diagnosis not present

## 2021-05-12 DIAGNOSIS — K5010 Crohn's disease of large intestine without complications: Secondary | ICD-10-CM | POA: Diagnosis not present

## 2021-05-12 DIAGNOSIS — R195 Other fecal abnormalities: Secondary | ICD-10-CM | POA: Diagnosis not present

## 2021-06-30 DIAGNOSIS — Z20822 Contact with and (suspected) exposure to covid-19: Secondary | ICD-10-CM | POA: Diagnosis not present

## 2021-09-15 DIAGNOSIS — K5140 Inflammatory polyps of colon without complications: Secondary | ICD-10-CM | POA: Diagnosis not present

## 2021-09-15 DIAGNOSIS — K6289 Other specified diseases of anus and rectum: Secondary | ICD-10-CM | POA: Diagnosis not present

## 2021-09-15 DIAGNOSIS — K529 Noninfective gastroenteritis and colitis, unspecified: Secondary | ICD-10-CM | POA: Diagnosis not present

## 2021-09-15 DIAGNOSIS — K5010 Crohn's disease of large intestine without complications: Secondary | ICD-10-CM | POA: Diagnosis not present

## 2021-09-15 DIAGNOSIS — K6389 Other specified diseases of intestine: Secondary | ICD-10-CM | POA: Diagnosis not present

## 2021-09-15 DIAGNOSIS — K5289 Other specified noninfective gastroenteritis and colitis: Secondary | ICD-10-CM | POA: Diagnosis not present

## 2021-11-27 DIAGNOSIS — Z20822 Contact with and (suspected) exposure to covid-19: Secondary | ICD-10-CM | POA: Diagnosis not present

## 2022-01-02 DIAGNOSIS — K50119 Crohn's disease of large intestine with unspecified complications: Secondary | ICD-10-CM | POA: Diagnosis not present

## 2022-01-02 DIAGNOSIS — E559 Vitamin D deficiency, unspecified: Secondary | ICD-10-CM | POA: Diagnosis not present

## 2022-01-02 DIAGNOSIS — Z0000 Encounter for general adult medical examination without abnormal findings: Secondary | ICD-10-CM | POA: Diagnosis not present

## 2022-01-02 DIAGNOSIS — R7303 Prediabetes: Secondary | ICD-10-CM | POA: Diagnosis not present

## 2022-01-02 DIAGNOSIS — E7800 Pure hypercholesterolemia, unspecified: Secondary | ICD-10-CM | POA: Diagnosis not present

## 2022-05-02 DIAGNOSIS — S8391XA Sprain of unspecified site of right knee, initial encounter: Secondary | ICD-10-CM | POA: Diagnosis not present

## 2022-05-02 DIAGNOSIS — S8001XA Contusion of right knee, initial encounter: Secondary | ICD-10-CM | POA: Diagnosis not present

## 2022-05-12 DIAGNOSIS — S8001XA Contusion of right knee, initial encounter: Secondary | ICD-10-CM | POA: Diagnosis not present

## 2022-07-29 ENCOUNTER — Encounter (INDEPENDENT_AMBULATORY_CARE_PROVIDER_SITE_OTHER): Payer: Self-pay

## 2022-08-14 DIAGNOSIS — E559 Vitamin D deficiency, unspecified: Secondary | ICD-10-CM | POA: Diagnosis not present

## 2022-09-07 ENCOUNTER — Other Ambulatory Visit: Payer: Self-pay | Admitting: Family Medicine

## 2022-09-07 DIAGNOSIS — Z1231 Encounter for screening mammogram for malignant neoplasm of breast: Secondary | ICD-10-CM

## 2022-09-29 ENCOUNTER — Ambulatory Visit
Admission: RE | Admit: 2022-09-29 | Discharge: 2022-09-29 | Disposition: A | Discharge status: Home / Self Care | Payer: BC Managed Care – PPO | Source: Ambulatory Visit | Attending: Family Medicine | Admitting: Family Medicine

## 2022-09-29 DIAGNOSIS — Z1231 Encounter for screening mammogram for malignant neoplasm of breast: Secondary | ICD-10-CM

## 2022-12-07 IMAGING — MG MM DIGITAL SCREENING BILAT W/ TOMO AND CAD
8 of 15 series · 8 of 40 positions shown · non-contrast
Comparison: Previous exam(s).

ACR Breast Density Category a: The breast tissue is almost entirely
fatty.

CLINICAL DATA: Screening.

EXAM:
DIGITAL SCREENING BILATERAL MAMMOGRAM WITH TOMOSYNTHESIS AND CAD
TECHNIQUE: Bilateral screening digital craniocaudal and mediolateral oblique
mammograms were obtained. Bilateral screening digital breast
tomosynthesis was performed. The images were evaluated with
computer-aided detection.

[L MLO synth-2D (1 of 2)]
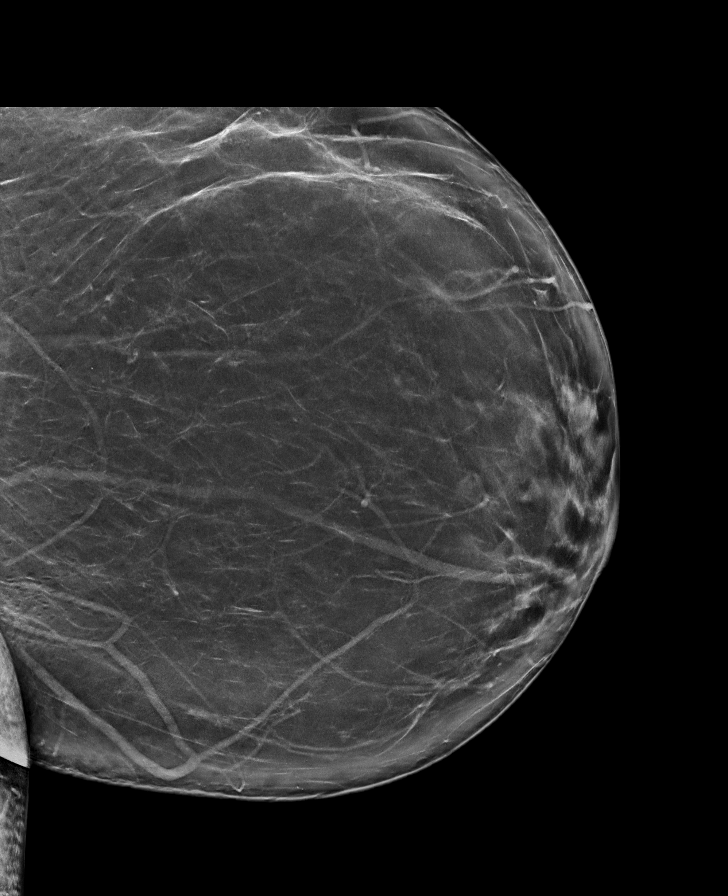

[L MLO synth-2D (2 of 2)]
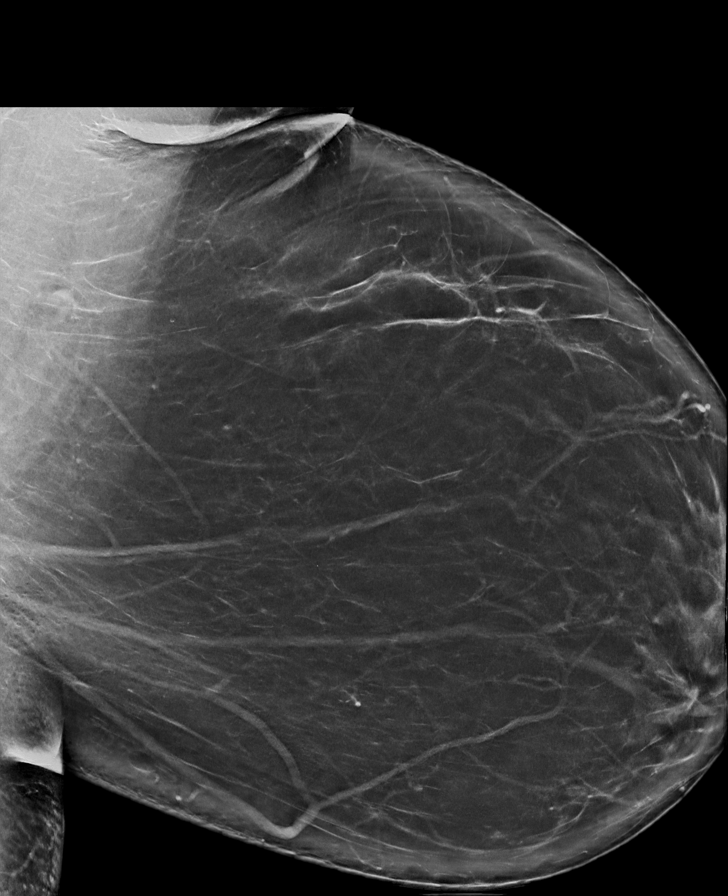

[L CC synth-2D (1 of 2)]
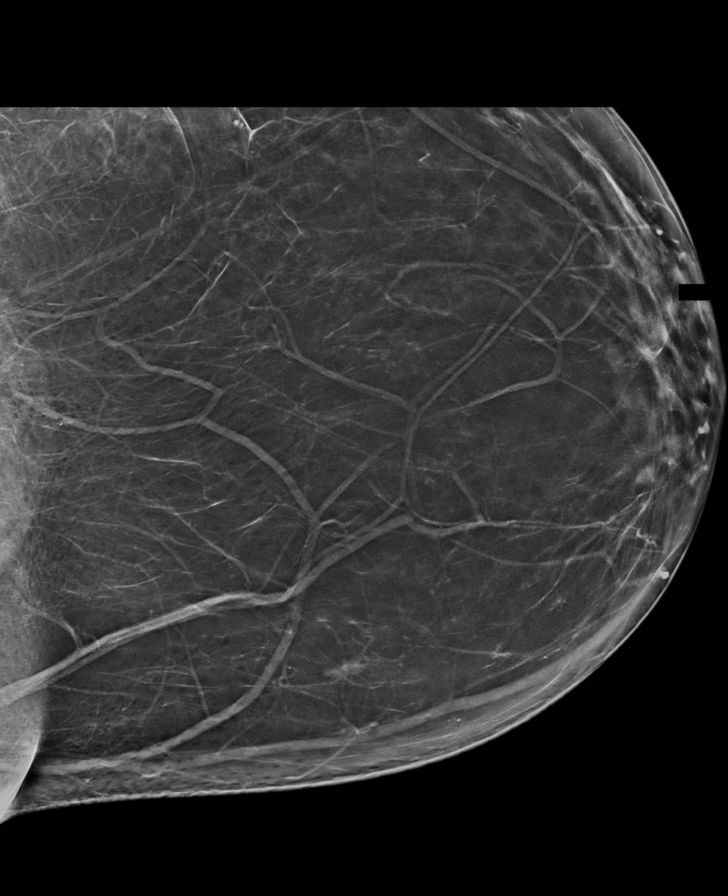

[L CC synth-2D (2 of 2)]
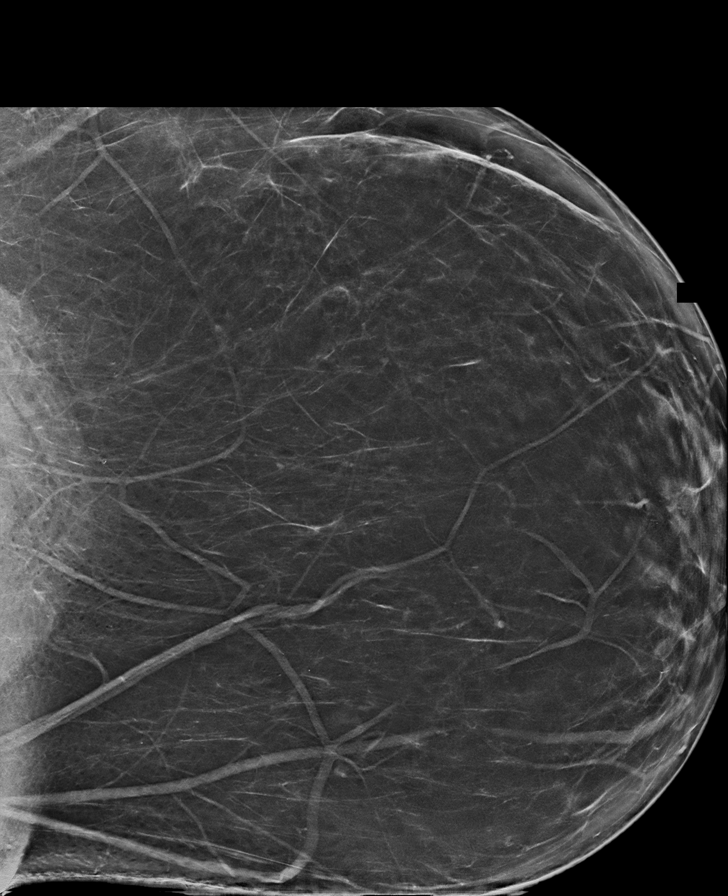

[R MLO synth-2D (1 of 2)]
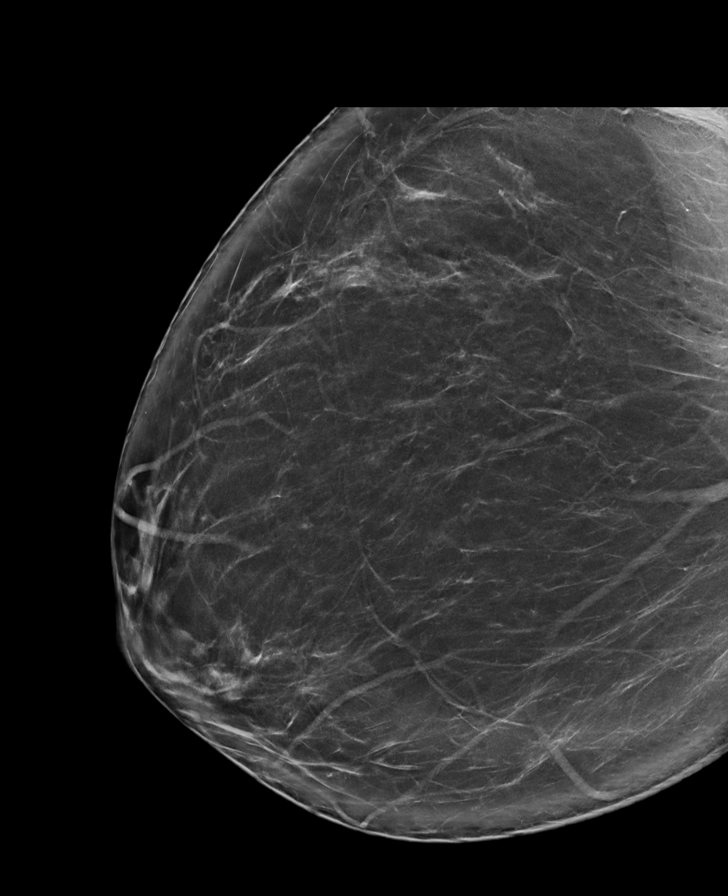

[R CC synth-2D]
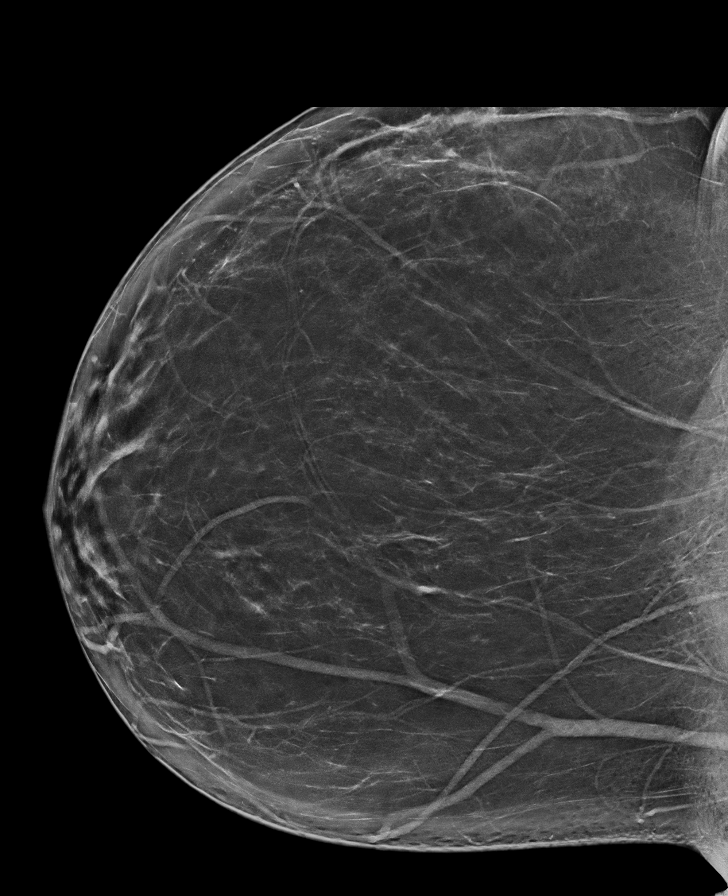

[R MLO synth-2D (2 of 2)]
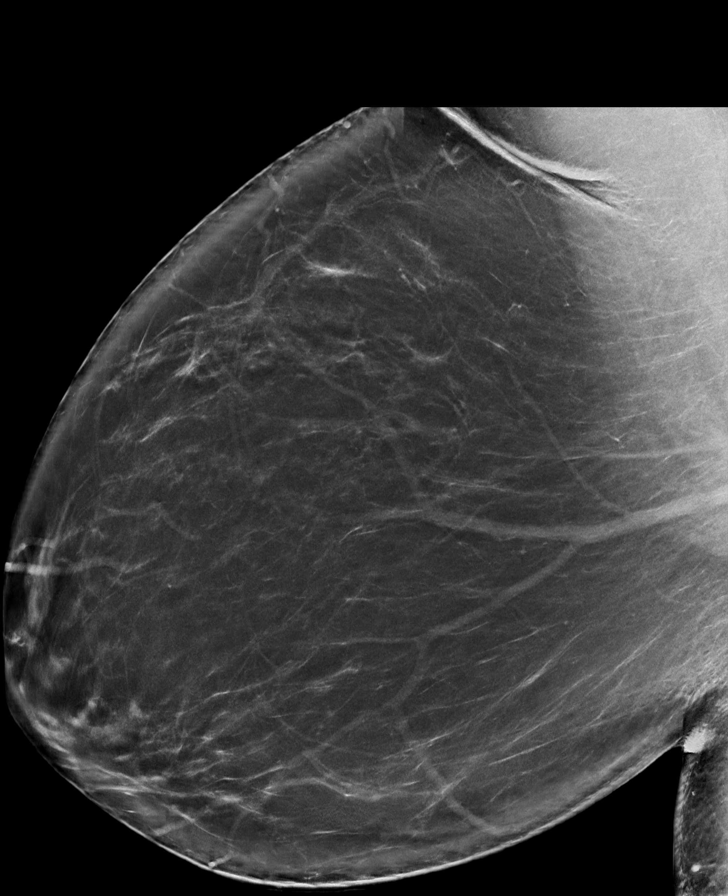

[R MLO tomo · tomo slice 51/102.0]
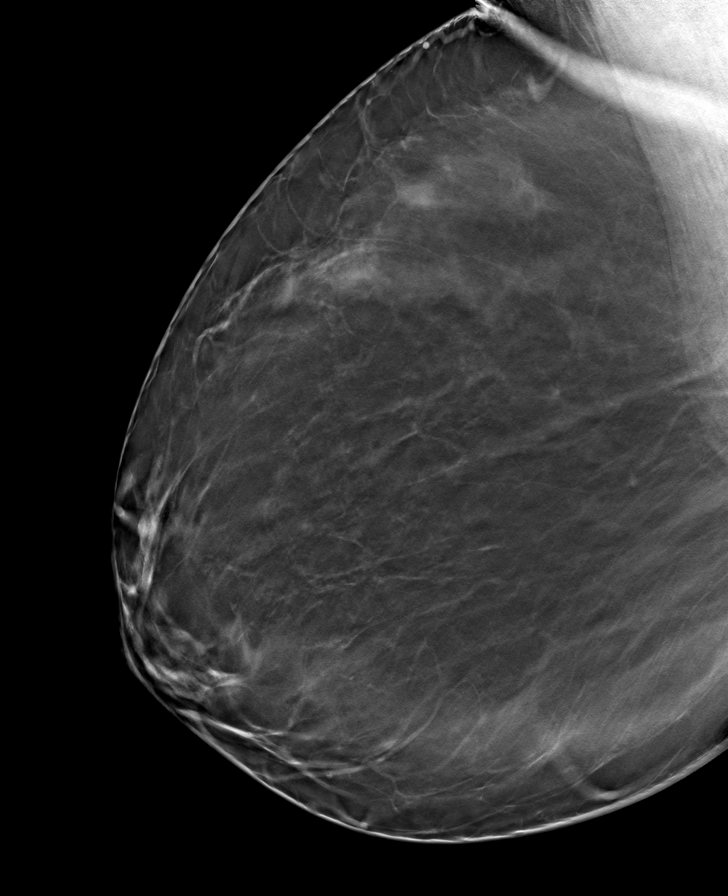

[8 of 40 positions shown; findings below may reference images not displayed]

FINDINGS: There are no findings suspicious for malignancy. The images were
evaluated with computer-aided detection.
IMPRESSION: No mammographic evidence of malignancy. A result letter of this
screening mammogram will be mailed directly to the patient.

RECOMMENDATION:
Screening mammogram in one year. (Code:[id number])

BI-RADS CATEGORY  1: Negative.

## 2023-02-03 DIAGNOSIS — Z23 Encounter for immunization: Secondary | ICD-10-CM | POA: Diagnosis not present

## 2023-02-03 DIAGNOSIS — E559 Vitamin D deficiency, unspecified: Secondary | ICD-10-CM | POA: Diagnosis not present

## 2023-02-03 DIAGNOSIS — D75839 Thrombocytosis, unspecified: Secondary | ICD-10-CM | POA: Diagnosis not present

## 2023-02-03 DIAGNOSIS — R7303 Prediabetes: Secondary | ICD-10-CM | POA: Diagnosis not present

## 2023-02-03 DIAGNOSIS — D649 Anemia, unspecified: Secondary | ICD-10-CM | POA: Diagnosis not present

## 2023-02-03 DIAGNOSIS — Z0000 Encounter for general adult medical examination without abnormal findings: Secondary | ICD-10-CM | POA: Diagnosis not present

## 2023-02-03 DIAGNOSIS — K50119 Crohn's disease of large intestine with unspecified complications: Secondary | ICD-10-CM | POA: Diagnosis not present

## 2023-02-03 DIAGNOSIS — E7800 Pure hypercholesterolemia, unspecified: Secondary | ICD-10-CM | POA: Diagnosis not present

## 2023-03-16 DIAGNOSIS — R946 Abnormal results of thyroid function studies: Secondary | ICD-10-CM | POA: Diagnosis not present

## 2023-11-02 ENCOUNTER — Other Ambulatory Visit: Payer: Self-pay | Admitting: Internal Medicine

## 2023-11-02 DIAGNOSIS — Z1231 Encounter for screening mammogram for malignant neoplasm of breast: Secondary | ICD-10-CM

## 2023-11-25 DIAGNOSIS — K50119 Crohn's disease of large intestine with unspecified complications: Secondary | ICD-10-CM | POA: Diagnosis not present

## 2023-11-25 DIAGNOSIS — R1013 Epigastric pain: Secondary | ICD-10-CM | POA: Diagnosis not present

## 2023-11-29 ENCOUNTER — Ambulatory Visit
Admission: RE | Admit: 2023-11-29 | Discharge: 2023-11-29 | Disposition: A | Discharge status: Home / Self Care | Payer: BC Managed Care – PPO | Source: Ambulatory Visit

## 2023-11-29 DIAGNOSIS — Z1231 Encounter for screening mammogram for malignant neoplasm of breast: Secondary | ICD-10-CM | POA: Diagnosis not present

## 2024-03-06 DIAGNOSIS — Z23 Encounter for immunization: Secondary | ICD-10-CM | POA: Diagnosis not present

## 2024-03-06 DIAGNOSIS — E559 Vitamin D deficiency, unspecified: Secondary | ICD-10-CM | POA: Diagnosis not present

## 2024-03-06 DIAGNOSIS — Z0000 Encounter for general adult medical examination without abnormal findings: Secondary | ICD-10-CM | POA: Diagnosis not present

## 2024-03-06 DIAGNOSIS — D75839 Thrombocytosis, unspecified: Secondary | ICD-10-CM | POA: Diagnosis not present

## 2024-03-06 DIAGNOSIS — E7800 Pure hypercholesterolemia, unspecified: Secondary | ICD-10-CM | POA: Diagnosis not present

## 2024-03-06 DIAGNOSIS — K50119 Crohn's disease of large intestine with unspecified complications: Secondary | ICD-10-CM | POA: Diagnosis not present

## 2024-03-06 DIAGNOSIS — D649 Anemia, unspecified: Secondary | ICD-10-CM | POA: Diagnosis not present

## 2024-03-06 DIAGNOSIS — R7303 Prediabetes: Secondary | ICD-10-CM | POA: Diagnosis not present

## 2024-03-15 DIAGNOSIS — K5289 Other specified noninfective gastroenteritis and colitis: Secondary | ICD-10-CM | POA: Diagnosis not present

## 2024-03-15 DIAGNOSIS — K5010 Crohn's disease of large intestine without complications: Secondary | ICD-10-CM | POA: Diagnosis not present

## 2024-03-15 DIAGNOSIS — K2930 Chronic superficial gastritis without bleeding: Secondary | ICD-10-CM | POA: Diagnosis not present

## 2024-03-15 DIAGNOSIS — K6389 Other specified diseases of intestine: Secondary | ICD-10-CM | POA: Diagnosis not present

## 2024-03-15 DIAGNOSIS — R1013 Epigastric pain: Secondary | ICD-10-CM | POA: Diagnosis not present

## 2024-03-15 DIAGNOSIS — K5140 Inflammatory polyps of colon without complications: Secondary | ICD-10-CM | POA: Diagnosis not present

## 2024-04-19 DIAGNOSIS — M25512 Pain in left shoulder: Secondary | ICD-10-CM | POA: Diagnosis not present

## 2024-04-19 DIAGNOSIS — M9903 Segmental and somatic dysfunction of lumbar region: Secondary | ICD-10-CM | POA: Diagnosis not present

## 2024-04-19 DIAGNOSIS — M9907 Segmental and somatic dysfunction of upper extremity: Secondary | ICD-10-CM | POA: Diagnosis not present

## 2024-05-02 DIAGNOSIS — M9903 Segmental and somatic dysfunction of lumbar region: Secondary | ICD-10-CM | POA: Diagnosis not present

## 2024-05-02 DIAGNOSIS — M9905 Segmental and somatic dysfunction of pelvic region: Secondary | ICD-10-CM | POA: Diagnosis not present

## 2024-05-02 DIAGNOSIS — M9901 Segmental and somatic dysfunction of cervical region: Secondary | ICD-10-CM | POA: Diagnosis not present

## 2024-05-02 DIAGNOSIS — M9902 Segmental and somatic dysfunction of thoracic region: Secondary | ICD-10-CM | POA: Diagnosis not present

## 2024-05-04 DIAGNOSIS — M9901 Segmental and somatic dysfunction of cervical region: Secondary | ICD-10-CM | POA: Diagnosis not present

## 2024-05-04 DIAGNOSIS — M9905 Segmental and somatic dysfunction of pelvic region: Secondary | ICD-10-CM | POA: Diagnosis not present

## 2024-05-04 DIAGNOSIS — M9902 Segmental and somatic dysfunction of thoracic region: Secondary | ICD-10-CM | POA: Diagnosis not present

## 2024-05-04 DIAGNOSIS — M9903 Segmental and somatic dysfunction of lumbar region: Secondary | ICD-10-CM | POA: Diagnosis not present

## 2024-05-18 DIAGNOSIS — M9905 Segmental and somatic dysfunction of pelvic region: Secondary | ICD-10-CM | POA: Diagnosis not present

## 2024-05-18 DIAGNOSIS — M9903 Segmental and somatic dysfunction of lumbar region: Secondary | ICD-10-CM | POA: Diagnosis not present

## 2024-05-18 DIAGNOSIS — M9902 Segmental and somatic dysfunction of thoracic region: Secondary | ICD-10-CM | POA: Diagnosis not present

## 2024-05-18 DIAGNOSIS — M9901 Segmental and somatic dysfunction of cervical region: Secondary | ICD-10-CM | POA: Diagnosis not present

## 2024-05-23 DIAGNOSIS — M9903 Segmental and somatic dysfunction of lumbar region: Secondary | ICD-10-CM | POA: Diagnosis not present

## 2024-05-23 DIAGNOSIS — M9902 Segmental and somatic dysfunction of thoracic region: Secondary | ICD-10-CM | POA: Diagnosis not present

## 2024-05-23 DIAGNOSIS — M9901 Segmental and somatic dysfunction of cervical region: Secondary | ICD-10-CM | POA: Diagnosis not present

## 2024-05-23 DIAGNOSIS — M9905 Segmental and somatic dysfunction of pelvic region: Secondary | ICD-10-CM | POA: Diagnosis not present

## 2024-05-25 DIAGNOSIS — M9902 Segmental and somatic dysfunction of thoracic region: Secondary | ICD-10-CM | POA: Diagnosis not present

## 2024-05-25 DIAGNOSIS — M9903 Segmental and somatic dysfunction of lumbar region: Secondary | ICD-10-CM | POA: Diagnosis not present

## 2024-05-25 DIAGNOSIS — M9901 Segmental and somatic dysfunction of cervical region: Secondary | ICD-10-CM | POA: Diagnosis not present

## 2024-05-25 DIAGNOSIS — M9905 Segmental and somatic dysfunction of pelvic region: Secondary | ICD-10-CM | POA: Diagnosis not present

## 2024-05-29 DIAGNOSIS — M9903 Segmental and somatic dysfunction of lumbar region: Secondary | ICD-10-CM | POA: Diagnosis not present

## 2024-05-29 DIAGNOSIS — M9902 Segmental and somatic dysfunction of thoracic region: Secondary | ICD-10-CM | POA: Diagnosis not present

## 2024-05-29 DIAGNOSIS — M9905 Segmental and somatic dysfunction of pelvic region: Secondary | ICD-10-CM | POA: Diagnosis not present

## 2024-05-29 DIAGNOSIS — M9901 Segmental and somatic dysfunction of cervical region: Secondary | ICD-10-CM | POA: Diagnosis not present

## 2024-06-06 DIAGNOSIS — M9902 Segmental and somatic dysfunction of thoracic region: Secondary | ICD-10-CM | POA: Diagnosis not present

## 2024-06-06 DIAGNOSIS — M9903 Segmental and somatic dysfunction of lumbar region: Secondary | ICD-10-CM | POA: Diagnosis not present

## 2024-06-06 DIAGNOSIS — M9905 Segmental and somatic dysfunction of pelvic region: Secondary | ICD-10-CM | POA: Diagnosis not present

## 2024-06-06 DIAGNOSIS — M9901 Segmental and somatic dysfunction of cervical region: Secondary | ICD-10-CM | POA: Diagnosis not present

## 2024-06-07 DIAGNOSIS — M9901 Segmental and somatic dysfunction of cervical region: Secondary | ICD-10-CM | POA: Diagnosis not present

## 2024-06-07 DIAGNOSIS — M9903 Segmental and somatic dysfunction of lumbar region: Secondary | ICD-10-CM | POA: Diagnosis not present

## 2024-06-07 DIAGNOSIS — M9905 Segmental and somatic dysfunction of pelvic region: Secondary | ICD-10-CM | POA: Diagnosis not present

## 2024-06-07 DIAGNOSIS — M9902 Segmental and somatic dysfunction of thoracic region: Secondary | ICD-10-CM | POA: Diagnosis not present

## 2024-06-13 DIAGNOSIS — M9901 Segmental and somatic dysfunction of cervical region: Secondary | ICD-10-CM | POA: Diagnosis not present

## 2024-06-13 DIAGNOSIS — M9905 Segmental and somatic dysfunction of pelvic region: Secondary | ICD-10-CM | POA: Diagnosis not present

## 2024-06-13 DIAGNOSIS — M9902 Segmental and somatic dysfunction of thoracic region: Secondary | ICD-10-CM | POA: Diagnosis not present

## 2024-06-13 DIAGNOSIS — M9903 Segmental and somatic dysfunction of lumbar region: Secondary | ICD-10-CM | POA: Diagnosis not present

## 2024-06-20 DIAGNOSIS — M25512 Pain in left shoulder: Secondary | ICD-10-CM | POA: Diagnosis not present

## 2024-06-20 DIAGNOSIS — M9907 Segmental and somatic dysfunction of upper extremity: Secondary | ICD-10-CM | POA: Diagnosis not present

## 2024-06-20 DIAGNOSIS — M9903 Segmental and somatic dysfunction of lumbar region: Secondary | ICD-10-CM | POA: Diagnosis not present

## 2024-07-04 DIAGNOSIS — M9902 Segmental and somatic dysfunction of thoracic region: Secondary | ICD-10-CM | POA: Diagnosis not present

## 2024-07-04 DIAGNOSIS — M9905 Segmental and somatic dysfunction of pelvic region: Secondary | ICD-10-CM | POA: Diagnosis not present

## 2024-07-04 DIAGNOSIS — M9901 Segmental and somatic dysfunction of cervical region: Secondary | ICD-10-CM | POA: Diagnosis not present

## 2024-07-04 DIAGNOSIS — M9903 Segmental and somatic dysfunction of lumbar region: Secondary | ICD-10-CM | POA: Diagnosis not present

## 2024-07-10 DIAGNOSIS — M9905 Segmental and somatic dysfunction of pelvic region: Secondary | ICD-10-CM | POA: Diagnosis not present

## 2024-07-10 DIAGNOSIS — M9902 Segmental and somatic dysfunction of thoracic region: Secondary | ICD-10-CM | POA: Diagnosis not present

## 2024-07-10 DIAGNOSIS — M9903 Segmental and somatic dysfunction of lumbar region: Secondary | ICD-10-CM | POA: Diagnosis not present

## 2024-07-10 DIAGNOSIS — M9901 Segmental and somatic dysfunction of cervical region: Secondary | ICD-10-CM | POA: Diagnosis not present

## 2024-07-12 DIAGNOSIS — M9901 Segmental and somatic dysfunction of cervical region: Secondary | ICD-10-CM | POA: Diagnosis not present

## 2024-07-12 DIAGNOSIS — M9903 Segmental and somatic dysfunction of lumbar region: Secondary | ICD-10-CM | POA: Diagnosis not present

## 2024-07-12 DIAGNOSIS — M9902 Segmental and somatic dysfunction of thoracic region: Secondary | ICD-10-CM | POA: Diagnosis not present

## 2024-07-12 DIAGNOSIS — M9905 Segmental and somatic dysfunction of pelvic region: Secondary | ICD-10-CM | POA: Diagnosis not present

## 2024-07-17 DIAGNOSIS — M9901 Segmental and somatic dysfunction of cervical region: Secondary | ICD-10-CM | POA: Diagnosis not present

## 2024-07-17 DIAGNOSIS — M9902 Segmental and somatic dysfunction of thoracic region: Secondary | ICD-10-CM | POA: Diagnosis not present

## 2024-07-17 DIAGNOSIS — M9905 Segmental and somatic dysfunction of pelvic region: Secondary | ICD-10-CM | POA: Diagnosis not present

## 2024-07-17 DIAGNOSIS — M9903 Segmental and somatic dysfunction of lumbar region: Secondary | ICD-10-CM | POA: Diagnosis not present

## 2024-07-25 DIAGNOSIS — M9907 Segmental and somatic dysfunction of upper extremity: Secondary | ICD-10-CM | POA: Diagnosis not present

## 2024-07-25 DIAGNOSIS — M25512 Pain in left shoulder: Secondary | ICD-10-CM | POA: Diagnosis not present

## 2024-07-25 DIAGNOSIS — M9901 Segmental and somatic dysfunction of cervical region: Secondary | ICD-10-CM | POA: Diagnosis not present

## 2024-07-25 DIAGNOSIS — M47896 Other spondylosis, lumbar region: Secondary | ICD-10-CM | POA: Diagnosis not present

## 2024-07-25 DIAGNOSIS — M5442 Lumbago with sciatica, left side: Secondary | ICD-10-CM | POA: Diagnosis not present

## 2024-07-25 DIAGNOSIS — M9902 Segmental and somatic dysfunction of thoracic region: Secondary | ICD-10-CM | POA: Diagnosis not present

## 2024-07-27 DIAGNOSIS — M9902 Segmental and somatic dysfunction of thoracic region: Secondary | ICD-10-CM | POA: Diagnosis not present

## 2024-07-27 DIAGNOSIS — M9901 Segmental and somatic dysfunction of cervical region: Secondary | ICD-10-CM | POA: Diagnosis not present

## 2024-07-27 DIAGNOSIS — M9905 Segmental and somatic dysfunction of pelvic region: Secondary | ICD-10-CM | POA: Diagnosis not present

## 2024-08-03 DIAGNOSIS — M9902 Segmental and somatic dysfunction of thoracic region: Secondary | ICD-10-CM | POA: Diagnosis not present

## 2024-08-03 DIAGNOSIS — M9903 Segmental and somatic dysfunction of lumbar region: Secondary | ICD-10-CM | POA: Diagnosis not present

## 2024-08-03 DIAGNOSIS — M9905 Segmental and somatic dysfunction of pelvic region: Secondary | ICD-10-CM | POA: Diagnosis not present

## 2024-08-03 DIAGNOSIS — M9901 Segmental and somatic dysfunction of cervical region: Secondary | ICD-10-CM | POA: Diagnosis not present

## 2024-08-08 DIAGNOSIS — M9901 Segmental and somatic dysfunction of cervical region: Secondary | ICD-10-CM | POA: Diagnosis not present

## 2024-08-08 DIAGNOSIS — M9903 Segmental and somatic dysfunction of lumbar region: Secondary | ICD-10-CM | POA: Diagnosis not present

## 2024-08-08 DIAGNOSIS — M9905 Segmental and somatic dysfunction of pelvic region: Secondary | ICD-10-CM | POA: Diagnosis not present

## 2024-08-08 DIAGNOSIS — M9902 Segmental and somatic dysfunction of thoracic region: Secondary | ICD-10-CM | POA: Diagnosis not present

## 2024-08-16 DIAGNOSIS — M9905 Segmental and somatic dysfunction of pelvic region: Secondary | ICD-10-CM | POA: Diagnosis not present

## 2024-08-16 DIAGNOSIS — M9901 Segmental and somatic dysfunction of cervical region: Secondary | ICD-10-CM | POA: Diagnosis not present

## 2024-08-16 DIAGNOSIS — M9903 Segmental and somatic dysfunction of lumbar region: Secondary | ICD-10-CM | POA: Diagnosis not present

## 2024-08-16 DIAGNOSIS — M9902 Segmental and somatic dysfunction of thoracic region: Secondary | ICD-10-CM | POA: Diagnosis not present

## 2024-11-22 ENCOUNTER — Other Ambulatory Visit: Payer: Self-pay | Admitting: Internal Medicine

## 2024-11-22 DIAGNOSIS — Z1231 Encounter for screening mammogram for malignant neoplasm of breast: Secondary | ICD-10-CM

## 2024-11-30 DIAGNOSIS — R1013 Epigastric pain: Secondary | ICD-10-CM | POA: Diagnosis not present

## 2024-11-30 DIAGNOSIS — K50119 Crohn's disease of large intestine with unspecified complications: Secondary | ICD-10-CM | POA: Diagnosis not present

## 2024-12-19 ENCOUNTER — Ambulatory Visit
Admission: RE | Admit: 2024-12-19 | Discharge: 2024-12-19 | Disposition: A | Discharge status: Home / Self Care | Source: Ambulatory Visit

## 2024-12-19 DIAGNOSIS — Z1231 Encounter for screening mammogram for malignant neoplasm of breast: Secondary | ICD-10-CM
